# Patient Record
Sex: Male | Born: 1980 | Race: White | Hispanic: No | Marital: Married | Smoking: Never smoker
Health system: Southern US, Community
[De-identification: ages and names within clinical notes are randomized; demographics above are authoritative.]

## PROBLEM LIST (undated history)

## (undated) DIAGNOSIS — E669 Obesity, unspecified: Secondary | ICD-10-CM

## (undated) DIAGNOSIS — F988 Other specified behavioral and emotional disorders with onset usually occurring in childhood and adolescence: Secondary | ICD-10-CM

## (undated) DIAGNOSIS — M5090 Cervical disc disorder, unspecified, unspecified cervical region: Secondary | ICD-10-CM

## (undated) DIAGNOSIS — I861 Scrotal varices: Secondary | ICD-10-CM

## (undated) DIAGNOSIS — N433 Hydrocele, unspecified: Secondary | ICD-10-CM

## (undated) DIAGNOSIS — N200 Calculus of kidney: Secondary | ICD-10-CM

## (undated) DIAGNOSIS — F419 Anxiety disorder, unspecified: Secondary | ICD-10-CM

## (undated) HISTORY — DX: Anxiety disorder, unspecified: F41.9

## (undated) HISTORY — DX: Hydrocele, unspecified: N43.3

## (undated) HISTORY — DX: Obesity, unspecified: E66.9

## (undated) HISTORY — DX: Cervical disc disorder, unspecified, unspecified cervical region: M50.90

## (undated) HISTORY — DX: Calculus of kidney: N20.0

## (undated) HISTORY — DX: Rider (driver) (passenger) of other motorcycle injured in unspecified traffic accident, initial encounter: V29.99XA

## (undated) HISTORY — DX: Scrotal varices: I86.1

## (undated) HISTORY — PX: LAPAROSCOPIC INGUINAL HERNIA REPAIR PEDIATRIC: SHX6767

## (undated) HISTORY — DX: Other specified behavioral and emotional disorders with onset usually occurring in childhood and adolescence: F98.8

## (undated) HISTORY — PX: DG 2ND DIGIT LEFT HAND: HXRAD1642

---

## 2015-04-13 DIAGNOSIS — F988 Other specified behavioral and emotional disorders with onset usually occurring in childhood and adolescence: Secondary | ICD-10-CM | POA: Insufficient documentation

## 2015-04-13 DIAGNOSIS — F411 Generalized anxiety disorder: Secondary | ICD-10-CM | POA: Insufficient documentation

## 2015-04-13 DIAGNOSIS — F902 Attention-deficit hyperactivity disorder, combined type: Secondary | ICD-10-CM | POA: Insufficient documentation

## 2016-02-19 DIAGNOSIS — F1320 Sedative, hypnotic or anxiolytic dependence, uncomplicated: Secondary | ICD-10-CM | POA: Insufficient documentation

## 2017-06-05 ENCOUNTER — Ambulatory Visit (INDEPENDENT_AMBULATORY_CARE_PROVIDER_SITE_OTHER): Payer: BLUE CROSS/BLUE SHIELD

## 2017-06-05 ENCOUNTER — Ambulatory Visit (INDEPENDENT_AMBULATORY_CARE_PROVIDER_SITE_OTHER): Payer: BLUE CROSS/BLUE SHIELD | Admitting: Physician Assistant

## 2017-06-05 ENCOUNTER — Encounter: Payer: Self-pay | Admitting: Physician Assistant

## 2017-06-05 VITALS — BP 121/82 | HR 81 | Ht 67.0 in | Wt 203.0 lb

## 2017-06-05 DIAGNOSIS — M87059 Idiopathic aseptic necrosis of unspecified femur: Secondary | ICD-10-CM | POA: Diagnosis not present

## 2017-06-05 DIAGNOSIS — M5441 Lumbago with sciatica, right side: Secondary | ICD-10-CM | POA: Diagnosis not present

## 2017-06-05 DIAGNOSIS — Z7689 Persons encountering health services in other specified circumstances: Secondary | ICD-10-CM

## 2017-06-05 DIAGNOSIS — M5116 Intervertebral disc disorders with radiculopathy, lumbar region: Secondary | ICD-10-CM

## 2017-06-05 DIAGNOSIS — N50819 Testicular pain, unspecified: Secondary | ICD-10-CM | POA: Diagnosis not present

## 2017-06-05 DIAGNOSIS — M5117 Intervertebral disc disorders with radiculopathy, lumbosacral region: Secondary | ICD-10-CM

## 2017-06-05 HISTORY — DX: Idiopathic aseptic necrosis of unspecified femur: M87.059

## 2017-06-05 MED ORDER — CYCLOBENZAPRINE HCL 10 MG PO TABS: 10.0000 mg | ORAL_TABLET | Freq: Every evening | ORAL | 0 refills | Status: DC | PRN

## 2017-06-05 MED ORDER — PREDNISONE 50 MG PO TABS: 50.0000 mg | ORAL_TABLET | Freq: Every day | ORAL | 0 refills | Status: DC

## 2017-06-05 NOTE — Patient Instructions (Addendum)
For your back: - MRI today - stop Ibuprofen and Aleve - Prednisone daily for 5 days - Cyclobenzaprine at bedtime as needed for muscle tightness/spasm (will cause drowsiness) - formal physical therapy - follow-up with Sports Medicine in 4 weeks
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
## 2017-06-05 NOTE — Progress Notes (Signed)
MRI shows disc disease at L5-S1 and L4-L5 There is no spinal stenosis or cauda equina syndrome Nothing emergent or surgical Treatment plan does not change Physical therapy and follow-up with Sports Medicine

## 2017-06-07 ENCOUNTER — Other Ambulatory Visit: Payer: Self-pay | Admitting: Physician Assistant

## 2017-06-07 ENCOUNTER — Encounter: Payer: Self-pay | Admitting: Physician Assistant

## 2017-06-07 ENCOUNTER — Ambulatory Visit (INDEPENDENT_AMBULATORY_CARE_PROVIDER_SITE_OTHER): Payer: BLUE CROSS/BLUE SHIELD

## 2017-06-07 DIAGNOSIS — I861 Scrotal varices: Secondary | ICD-10-CM | POA: Diagnosis not present

## 2017-06-07 DIAGNOSIS — N50819 Testicular pain, unspecified: Secondary | ICD-10-CM

## 2017-06-07 DIAGNOSIS — N433 Hydrocele, unspecified: Secondary | ICD-10-CM

## 2017-06-07 HISTORY — DX: Hydrocele, unspecified: N43.3

## 2017-06-07 HISTORY — DX: Scrotal varices: I86.1

## 2017-06-07 NOTE — Progress Notes (Signed)
Ultrasound shows some bilateral hydroceles and varicoceles These are both benign findings. Sometimes they can cause pain and issues with fertility A hydrocele is a small fluid collection A varicocele is an abnormal group of veins I can refer him to urology if symptoms are bothersome to discuss surgical treatment

## 2017-07-05 ENCOUNTER — Encounter: Payer: BLUE CROSS/BLUE SHIELD | Admitting: Sports Medicine

## 2017-07-18 ENCOUNTER — Other Ambulatory Visit: Payer: Self-pay | Admitting: Physician Assistant

## 2017-07-18 DIAGNOSIS — M5441 Lumbago with sciatica, right side: Secondary | ICD-10-CM

## 2017-08-03 ENCOUNTER — Other Ambulatory Visit: Payer: Self-pay | Admitting: Physician Assistant

## 2017-08-03 DIAGNOSIS — M5441 Lumbago with sciatica, right side: Secondary | ICD-10-CM

## 2018-03-24 ENCOUNTER — Encounter: Payer: Self-pay | Admitting: Physician Assistant

## 2018-05-28 ENCOUNTER — Ambulatory Visit (INDEPENDENT_AMBULATORY_CARE_PROVIDER_SITE_OTHER): Payer: BLUE CROSS/BLUE SHIELD

## 2018-05-28 ENCOUNTER — Ambulatory Visit: Payer: BLUE CROSS/BLUE SHIELD | Admitting: Family Medicine

## 2018-05-28 DIAGNOSIS — M87059 Idiopathic aseptic necrosis of unspecified femur: Secondary | ICD-10-CM

## 2018-05-28 DIAGNOSIS — M25552 Pain in left hip: Secondary | ICD-10-CM | POA: Diagnosis not present

## 2018-05-28 MED ORDER — CELECOXIB 200 MG PO CAPS: ORAL_CAPSULE | ORAL | 2 refills | Status: DC

## 2018-05-28 NOTE — Patient Instructions (Addendum)
Thank you for coming in today. I think you have AVN causing early wear of the hip joint.  I also think you have tendonitis/bursitis of the hip muscles.  We will work on rehab the muscles and tendons which will help the pain in the later leg.  Do the side leg raise and the standing stretch.  Let me know who your preferred orthopedics is for hip replacement.   Use celebrex instead of iburpfoen for pain as need.  OK to take with tylenol .    Avascular Necrosis Avascular necrosis is death of bone tissue due to lack of blood supply. This condition may also be called:  Osteonecrosis.  Aseptic necrosis.  Ischemic bone necrosis. Without proper blood supply, the internal layer of the affected bone dies. Over time, small breaks form in the outer layer of the bone. If this process affects a bone near a joint, the joint may collapse or move out of place (become dislocated). Joints that may be affected include the jaw, wrist, fingers, knee, and foot. Avascular necrosis most commonly affects:  The hip joint, especially the top of the thigh bone (femoral head).  The top of the upper arm bone (humeral head). What are the causes? This condition may be caused by:  Damage or injury to a bone or joint.  Using steroid medicine such as prednisone for a long time.  Changes in the body's disease-fighting system (immune system).  Changes in the body's system of chemicals that regulate body processes (hormones).  A lot of exposure to radiation. What increases the risk? The following factors may make you more likely to develop this condition:  Alcohol abuse.  A history of joint injury.  Long-term or frequent use of steroid medicines.  Having a medical condition such as: ? HIV or AIDS. ? Diabetes. ? Sickle cell disease. ? A disease in which your body's immune system attacks your body's own healthy tissues (autoimmune disease). What are the signs or symptoms? The main symptoms of avascular  necrosis are:  Pain. If an affected joint collapses, the pain may suddenly get severe.  Decreased ability to move the affected bone or joint. How is this diagnosed? Avascular necrosis may be diagnosed based on:  Your symptoms and medical history.  A physical exam.  Imaging tests, such as X-rays, bone scans, and an MRI. How is this treated? Treatment for this condition may include:  Pain medicine, such as NSAIDs.  Medicine to improve bone growth.  Avoiding placing any pressure or weight on the affected area. If avascular necrosis occurs in your hip, ankle, or foot, you may need to use a device to help you move around (assistive device), such as crutches or a rolling scooter.  Physical therapy to help regain strength and motion in the affected area.  Surgery, such as: ? Core decompression. In this surgery, one or more holes are placed in the bone for new blood vessels to grow into. This provides a renewed blood supply to the bone. This surgery may reduce pain and pressure in the affected bone and slow the destruction of bones and joints. ? Osteotomy. In this surgery, the bone is reshaped to reduce stress on the affected area of the joint. ? Bone grafting. In this surgery, healthy bone from a different part of your body is used to replace damaged bone. ? Total joint replacement (arthroplasty). In this surgery, the affected surfaces of bone on one or both sides of a joint are replaced with artificial parts (prostheses).  Electrical stimulation (also called e-stim). During this procedure, a probe over the skin sends shock waves into the body. This may help to encourage new bone growth.  High-pressure oxygen therapy (hyperbaric oxygen). This is rarely used. Follow these instructions at home: Activity  Ask your health care provider what activities are safe for you.  If physical therapy was prescribed, do exercises as told by your health care provider.  Avoid placing any pressure or  weight on the affected area, as told by your health care provider. Use assistive devices as instructed. Managing pain, stiffness, and swelling  If directed, apply heat to the affected area as often as told by your health care provider. Heat can reduce the stiffness of your muscles and joints. Use the heat source that your health care provider recommends, such as a moist heat pack or a heating pad. ? Place a towel between your skin and the heat source. ? Leave the heat on for 20-30 minutes. ? Remove the heat if your skin turns bright red. This is especially important if you are unable to feel pain, heat, or cold. You may have a greater risk of getting burned.  If directed, put ice on affected areas. Icing can help to relieve joint pain and swelling. ? Put ice in a plastic bag. ? Place a towel between your skin and the bag. ? Leave the ice on for 20 minutes, 2-3 times a day. General instructions   Take over-the-counter and prescription medicines only as told by your health care provider.  Do not drive or use heavy machinery while taking prescription pain medicine.  If a bone in your arm or leg is affected, ask your health care provider if it is safe for you to drive.  Do not use any products that contain nicotine or tobacco, such as cigarettes and e-cigarettes. These can delay bone healing. If you need help quitting, ask your health care provider.  Do not drink alcohol.  Keep all follow-up visits as told by your health care provider. This is important. Contact a health care provider if:  You have pain that gets worse or does not get better with medicine.  Your ability to move your joint gets worse. Get help right away if:  Your pain suddenly becomes severe. Summary  Avascular necrosis is death of bone tissue due to a lack of blood supply.  Without proper blood supply, the internal layer of the affected bone dies. Over time, small breaks form in the outer layer of the  bone.  Avoid putting any pressure or weight on the affected area. You may need to use a device to help you move around (assistive device), such as crutches or a rolling scooter. This information is not intended to replace advice given to you by your health care provider. Make sure you discuss any questions you have with your health care provider. Document Released: 10/29/2001 Document Revised: 04/18/2017 Document Reviewed: 04/18/2017 Elsevier Interactive Patient Education  2019 [employer]    Trochanteric Bursitis Trochanteric bursitis is a condition that causes hip pain. Trochanteric bursitis happens when fluid-filled sacs (bursae) in the hip get irritated. Normally these sacs absorb shock and help strong bands of tissue (tendons) in your hip glide smoothly over each other and over your hip bones. What are the causes? This condition results from increased friction between the hip bones and the tendons that go over them. This condition can happen if you:  Have weak hips.  Use your hip muscles too much (overuse).  Get hit in the hip. What increases the risk? This condition is more likely to develop in:  Women.  Adults who are middle-aged or older.  People with arthritis or a spinal condition.  People with weak buttocks muscles (gluteal muscles).  People who have one leg that is shorter than the other.  People who participate in certain kinds of athletic activities, such as: ? Running sports, especially long-distance running. ? Contact sports, like football or martial arts. ? Sports in which falls may occur, like skiing. What are the signs or symptoms? The main symptom of this condition is pain and tenderness over the point of your hip. The pain may be:  Sharp and intense.  Dull and achy.  Felt on the outside of your thigh. It may increase when you:  Lie on your side.  Walk or run.  Go up on stairs.  Sit.  Stand up after sitting.  Stand for long periods of  time. How is this diagnosed? This condition may be diagnosed based on:  Your symptoms.  Your medical history.  A physical exam.  Imaging tests, such as: ? X-rays to check your bones. ? An MRI or ultrasound to check your tendons and muscles. During your physical exam, your health care provider will check the movement and strength of your hip. He or she may press on the point of your hip to check for pain. How is this treated? This condition may be treated by:  Resting.  Reducing your activity.  Avoiding activities that cause pain.  Using crutches, a cane, or a walker to decrease the strain on your hip.  Taking medicine to help with swelling.  Having medicine injected into the bursae to help with swelling.  Using ice, heat, and massage therapy for pain relief.  Physical therapy exercises for strength and flexibility.  Surgery (rare). Follow these instructions at home: Activity  Rest.  Avoid activities that cause pain.  Return to your normal activities as told by your health care provider. Ask your health care provider what activities are safe for you. Managing pain, stiffness, and swelling  Take over-the-counter and prescription medicines only as told by your health care provider.  If directed, apply heat to the injured area as told by your health care provider. ? Place a towel between your skin and the heat source. ? Leave the heat on for 20-30 minutes. ? Remove the heat if your skin turns bright red. This is especially important if you are unable to feel pain, heat, or cold. You may have a greater risk of getting burned.  If directed, apply ice to the injured area: ? Put ice in a plastic bag. ? Place a towel between your skin and the bag. ? Leave the ice on for 20 minutes, 2-3 times a day. General instructions  If the affected leg is one that you use for driving, ask your health care provider when it is safe to drive.  Use crutches, a cane, or a walker as  told by your health care provider.  If one of your legs is shorter than the other, get fitted for a shoe insert.  Lose weight if you are overweight. How is this prevented?  Wear supportive footwear that is appropriate for your sport.  If you have hip pain, start any new exercise or sport slowly.  Maintain physical fitness, including: ? Strength. ? Flexibility. Contact a health care provider if:  Your pain does not improve with 2-4 weeks. Get help right away  if:  You develop severe pain.  You have a fever.  You develop increased redness over your hip.  You have a change in your bowel function or bladder function.  You cannot control the muscles in your feet. This information is not intended to replace advice given to you by your health care provider. Make sure you discuss any questions you have with your health care provider. Document Released: 06/16/2004 Document Revised: 01/13/2016 Document Reviewed: 04/24/2015 Elsevier Interactive Patient Education  2019 [employer].
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
## 2018-06-15 ENCOUNTER — Telehealth: Payer: Self-pay | Admitting: Family Medicine

## 2018-06-15 NOTE — Telephone Encounter (Signed)
Received remote consultation/second opinion from orthopedic surgeon Dr. Lenor Coffin.  Comprehensive document reviewed.   Surgery recommends maximizing medical options but best long-term surgical option is total hip replacement.  Surgery does note that because there is already some collapse of femoral head steroid injection may be a reasonable option for temporary pain control.  We will send report to scan.

## 2018-07-03 ENCOUNTER — Encounter: Payer: Self-pay | Admitting: Family Medicine

## 2018-07-16 ENCOUNTER — Encounter: Payer: Self-pay | Admitting: Physician Assistant

## 2018-07-20 ENCOUNTER — Encounter: Payer: Self-pay | Admitting: Physician Assistant

## 2018-07-20 ENCOUNTER — Ambulatory Visit: Payer: BLUE CROSS/BLUE SHIELD | Admitting: Physician Assistant

## 2018-07-20 VITALS — BP 120/81 | HR 71 | Temp 98.2°F | Wt 197.0 lb

## 2018-07-20 DIAGNOSIS — H6191 Disorder of right external ear, unspecified: Secondary | ICD-10-CM

## 2018-07-20 DIAGNOSIS — H6192 Disorder of left external ear, unspecified: Secondary | ICD-10-CM | POA: Insufficient documentation

## 2018-07-20 DIAGNOSIS — F988 Other specified behavioral and emotional disorders with onset usually occurring in childhood and adolescence: Secondary | ICD-10-CM

## 2018-07-20 DIAGNOSIS — Z01818 Encounter for other preprocedural examination: Secondary | ICD-10-CM

## 2018-07-20 DIAGNOSIS — Z23 Encounter for immunization: Secondary | ICD-10-CM | POA: Diagnosis not present

## 2018-07-20 DIAGNOSIS — F419 Anxiety disorder, unspecified: Secondary | ICD-10-CM | POA: Diagnosis not present

## 2018-07-20 MED ORDER — LISDEXAMFETAMINE DIMESYLATE 50 MG PO CAPS: 50.0000 mg | ORAL_CAPSULE | Freq: Every day | ORAL | 0 refills | Status: DC

## 2018-07-20 NOTE — Patient Instructions (Signed)
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
## 2018-07-21 LAB — CBC WITH DIFFERENTIAL/PLATELET
Absolute Monocytes: 507 cells/uL (ref 200–950)
Basophils Absolute: 39 cells/uL (ref 0–200)
Basophils Relative: 0.6 %
EOS PCT: 5.8 %
Eosinophils Absolute: 377 cells/uL (ref 15–500)
HCT: 40.7 % (ref 38.5–50.0)
Hemoglobin: 13.9 g/dL (ref 13.2–17.1)
Lymphs Abs: 2308 cells/uL (ref 850–3900)
MCH: 29.3 pg (ref 27.0–33.0)
MCHC: 34.2 g/dL (ref 32.0–36.0)
MCV: 85.7 fL (ref 80.0–100.0)
MPV: 9.6 fL (ref 7.5–12.5)
Monocytes Relative: 7.8 %
NEUTROS ABS: 3270 {cells}/uL (ref 1500–7800)
Neutrophils Relative %: 50.3 %
PLATELETS: 253 10*3/uL (ref 140–400)
RBC: 4.75 10*6/uL (ref 4.20–5.80)
RDW: 12.8 % (ref 11.0–15.0)
Total Lymphocyte: 35.5 %
WBC: 6.5 10*3/uL (ref 3.8–10.8)

## 2018-07-21 LAB — URINALYSIS, ROUTINE W REFLEX MICROSCOPIC
Bilirubin Urine: NEGATIVE
Glucose, UA: NEGATIVE
Hgb urine dipstick: NEGATIVE
Ketones, ur: NEGATIVE
Leukocytes,Ua: NEGATIVE
Nitrite: NEGATIVE
Protein, ur: NEGATIVE
Specific Gravity, Urine: 1.016 (ref 1.001–1.03)
pH: 6.5 (ref 5.0–8.0)

## 2018-07-21 LAB — HEMOGLOBIN A1C
EAG (MMOL/L): 5.8 (calc)
HEMOGLOBIN A1C: 5.3 %{Hb} (ref <5.7)
Mean Plasma Glucose: 105 (calc)

## 2018-07-21 LAB — BASIC METABOLIC PANEL WITH GFR
BUN: 13 mg/dL (ref 7–25)
CALCIUM: 9.2 mg/dL (ref 8.6–10.3)
CO2: 26 mmol/L (ref 20–32)
CREATININE: 0.87 mg/dL (ref 0.60–1.35)
Chloride: 106 mmol/L (ref 98–110)
GFR, EST AFRICAN AMERICAN: 126 mL/min/{1.73_m2} (ref >=60)
GFR, Est Non African American: 109 mL/min/{1.73_m2} (ref >=60)
Glucose, Bld: 91 mg/dL (ref 65–99)
Potassium: 3.8 mmol/L (ref 3.5–5.3)
Sodium: 141 mmol/L (ref 135–146)

## 2018-07-21 LAB — URINE CULTURE
MICRO NUMBER: 258658
RESULT: NO GROWTH
SPECIMEN QUALITY: ADEQUATE

## 2018-07-25 ENCOUNTER — Encounter: Payer: Self-pay | Admitting: Physician Assistant

## 2018-07-25 ENCOUNTER — Ambulatory Visit: Payer: BLUE CROSS/BLUE SHIELD | Admitting: Physician Assistant

## 2018-07-25 VITALS — BP 128/84 | HR 83 | Temp 97.8°F | Wt 194.0 lb

## 2018-07-25 DIAGNOSIS — Z01818 Encounter for other preprocedural examination: Secondary | ICD-10-CM
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
## 2018-07-25 NOTE — Patient Instructions (Signed)
Stop Celebrex at least 2 days prior to surgery

## 2018-08-14 ENCOUNTER — Other Ambulatory Visit: Payer: Self-pay | Admitting: Physician Assistant

## 2018-08-14 DIAGNOSIS — F988 Other specified behavioral and emotional disorders with onset usually occurring in childhood and adolescence: Secondary | ICD-10-CM

## 2018-08-15 NOTE — Telephone Encounter (Signed)
Will send to provider for review .

## 2018-08-16 MED ORDER — LISDEXAMFETAMINE DIMESYLATE 50 MG PO CAPS: 50.0000 mg | ORAL_CAPSULE | Freq: Every day | ORAL | 0 refills | Status: DC

## 2018-08-24 ENCOUNTER — Encounter: Payer: Self-pay | Admitting: Family Medicine

## 2018-08-28 ENCOUNTER — Other Ambulatory Visit: Payer: Self-pay

## 2018-08-28 ENCOUNTER — Encounter: Payer: Self-pay | Admitting: Family Medicine

## 2018-08-28 ENCOUNTER — Ambulatory Visit (INDEPENDENT_AMBULATORY_CARE_PROVIDER_SITE_OTHER): Payer: BLUE CROSS/BLUE SHIELD | Admitting: Family Medicine

## 2018-08-28 DIAGNOSIS — G8929 Other chronic pain: Secondary | ICD-10-CM

## 2018-08-28 DIAGNOSIS — M87059 Idiopathic aseptic necrosis of unspecified femur: Secondary | ICD-10-CM | POA: Diagnosis not present

## 2018-08-28 MED ORDER — CELECOXIB 200 MG PO CAPS: 200.0000 mg | ORAL_CAPSULE | Freq: Two times a day (BID) | ORAL | 5 refills | Status: DC

## 2018-08-28 MED ORDER — TRAMADOL HCL 50 MG PO TABS: 50.0000 mg | ORAL_TABLET | Freq: Every day | ORAL | 0 refills | Status: DC | PRN
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
## 2018-08-28 NOTE — Patient Instructions (Addendum)
Thank you for coming in today.  Recheck in 3 weeks.  Use Celexrex and tyelneol for base line control.  Use tramdol for severe pain.  Recheck in 3 weeks via video visit.   Return sooner if needed.   Tramadol tablets What is this medicine? TRAMADOL (TRA ma dole) is a pain reliever. It is used to treat moderate to severe pain in adults. This medicine may be used for other purposes; ask your health care provider or pharmacist if you have questions. COMMON BRAND NAME(S): Ultram What should I tell my health care provider before I take this medicine? They need to know if you have any of these conditions: -brain tumor -depression -drug abuse or addiction -head injury -if you frequently drink alcohol containing drinks -kidney disease or trouble passing urine -liver disease -lung disease, asthma, or breathing problems -seizures or epilepsy -suicidal thoughts, plans, or attempt; a previous suicide attempt by you or a family member -an unusual or allergic reaction to tramadol, codeine, other medicines, foods, dyes, or preservatives -pregnant or trying to get pregnant -breast-feeding How should I use this medicine? Take this medicine by mouth with a full glass of water. Follow the directions on the prescription label. You can take it with or without food. If it upsets your stomach, take it with food. Do not take your medicine more often than directed. A special MedGuide will be given to you by the pharmacist with each prescription and refill. Be sure to read this information carefully each time. Talk to your pediatrician regarding the use of this medicine in children. Special care may be needed. Overdosage: If you think you have taken too much of this medicine contact a poison control center or emergency room at once. NOTE: This medicine is only for you. Do not share this medicine with others. What if I miss a dose? If you miss a dose, take it as soon as you can. If it is almost time for your  next dose, take only that dose. Do not take double or extra doses. What may interact with this medicine? Do not take this medication with any of the following medicines: -MAOIs like Carbex, Eldepryl, Marplan, Nardil, and Parnate This medicine may also interact with the following medications: -alcohol -antihistamines for allergy, cough and cold -certain medicines for anxiety or sleep -certain medicines for depression like amitriptyline, fluoxetine, sertraline -certain medicines for migraine headache like almotriptan, eletriptan, frovatriptan, naratriptan, rizatriptan, sumatriptan, zolmitriptan -certain medicines for seizures like carbamazepine, oxcarbazepine, phenobarbital, primidone -certain medicines that treat or prevent blood clots like warfarin -digoxin -furazolidone -general anesthetics like halothane, isoflurane, methoxyflurane, propofol -linezolid -local anesthetics like lidocaine, pramoxine, tetracaine -medicines that relax muscles for surgery -other narcotic medicines for pain or cough -phenothiazines like chlorpromazine, mesoridazine, prochlorperazine, thioridazine -procarbazine This list may not describe all possible interactions. Give your health care provider a list of all the medicines, herbs, non-prescription drugs, or dietary supplements you use. Also tell them if you smoke, drink alcohol, or use illegal drugs. Some items may interact with your medicine. What should I watch for while using this medicine? Tell your doctor or health care professional if your pain does not go away, if it gets worse, or if you have new or a different type of pain. You may develop tolerance to the medicine. Tolerance means that you will need a higher dose of the medicine for pain relief. Tolerance is normal and is expected if you take this medicine for a long time. Do not suddenly stop taking your  medicine because you may develop a severe reaction. Your body becomes used to the medicine. This does  NOT mean you are addicted. Addiction is a behavior related to getting and using a drug for a non-medical reason. If you have pain, you have a medical reason to take pain medicine. Your doctor will tell you how much medicine to take. If your doctor wants you to stop the medicine, the dose will be slowly lowered over time to avoid any side effects. There are different types of narcotic medicines (opiates). If you take more than one type at the same time or if you are taking another medicine that also causes drowsiness, you may have more side effects. Give your health care provider a list of all medicines you use. Your doctor will tell you how much medicine to take. Do not take more medicine than directed. Call emergency for help if you have problems breathing or unusual sleepiness. You may get drowsy or dizzy. Do not drive, use machinery, or do anything that needs mental alertness until you know how this medicine affects you. Do not stand or sit up quickly, especially if you are an older patient. This reduces the risk of dizzy or fainting spells. Alcohol can increase or decrease the effects of this medicine. Avoid alcoholic drinks. You may have constipation. Try to have a bowel movement at least every 2 to 3 days. If you do not have a bowel movement for 3 days, call your doctor or health care professional. Your mouth may get dry. Chewing sugarless gum or sucking hard candy, and drinking plenty of water may help. Contact your doctor if the problem does not go away or is severe. What side effects may I notice from receiving this medicine? Side effects that you should report to your doctor or health care professional as soon as possible: -allergic reactions like skin rash, itching or hives, swelling of the face, lips, or tongue -breathing problems -confusion -seizures -signs and symptoms of low blood pressure like dizziness; feeling faint or lightheaded, falls; unusually weak or tired -trouble passing urine  or change in the amount of urine Side effects that usually do not require medical attention (report to your doctor or health care professional if they continue or are bothersome): -constipation -dry mouth -nausea, vomiting -tiredness This list may not describe all possible side effects. Call your doctor for medical advice about side effects. You may report side effects to FDA at 1-800-FDA-1088. Where should I keep my medicine? Keep out of the reach of children. This medicine may cause accidental overdose and death if it taken by other adults, children, or pets. Mix any unused medicine with a substance like cat litter or coffee grounds. Then throw the medicine away in a sealed container like a sealed bag or a coffee can with a lid. Do not use the medicine after the expiration date. Store at room temperature between 15 and 30 degrees C (59 and 86 degrees F). NOTE: This sheet is a summary. It may not cover all possible information. If you have questions about this medicine, talk to your doctor, pharmacist, or health care provider.  2019 Elsevier/Gold Standard (2015-02-01 09:00:04)

## 2018-08-29 ENCOUNTER — Telehealth: Payer: Self-pay | Admitting: Physician Assistant

## 2018-08-29 MED ORDER — TRAMADOL HCL 50 MG PO TABS: 50.0000 mg | ORAL_TABLET | Freq: Every day | ORAL | 0 refills | Status: DC | PRN

## 2018-08-29 NOTE — Telephone Encounter (Signed)
-----   Message from Evan S Corey, MD sent at 08/28/2018  2:11 PM EDT ----- °Regarding: Recheck in 3 weeks °Please schedule follow up visit in 3 weeks via webex or mychart video for follow up hip pain and tramadol ° °

## 2018-08-29 NOTE — Telephone Encounter (Signed)
Called the pharmacy, there is no record of where he has filled this Rx before. They looked all the way back to 2019. I also do not see record of previous Tramadol Rx on his chart here. The pharmacy cut the Rx that was sent yesterday and Pt picked up the 7 day supply. Provider can now send 30 day Rx for fill.

## 2018-08-29 NOTE — Telephone Encounter (Signed)
-----   Message from Rodolph Bong, MD sent at 08/28/2018  2:11 PM EDT ----- Regarding: Recheck in 3 weeks Please schedule follow up visit in 3 weeks via webex or mychart video for follow up hip pain and tramadol

## 2018-08-29 NOTE — Telephone Encounter (Signed)
[facility] in [place] phone number [phone] and advised that the tramadol prescription I sent yesterday was for 30-day supply for chronic pain complaint.  This should have dispense 30 days and not 7.  Let me know if I need to change the prescription or send a new one.  Additionally please inform patient once this has been completed.

## 2018-08-29 NOTE — Telephone Encounter (Signed)
Appointment scheduled for webex. No further questions at this.

## 2018-09-06 ENCOUNTER — Encounter: Payer: Self-pay | Admitting: Family Medicine

## 2018-09-09 ENCOUNTER — Encounter: Payer: Self-pay | Admitting: Family Medicine

## 2018-09-11 ENCOUNTER — Encounter: Payer: Self-pay | Admitting: Family Medicine

## 2018-09-11 ENCOUNTER — Other Ambulatory Visit: Payer: Self-pay

## 2018-09-11 ENCOUNTER — Ambulatory Visit: Payer: BLUE CROSS/BLUE SHIELD | Admitting: Family Medicine

## 2018-09-11 ENCOUNTER — Ambulatory Visit (INDEPENDENT_AMBULATORY_CARE_PROVIDER_SITE_OTHER): Payer: BLUE CROSS/BLUE SHIELD

## 2018-09-11 VITALS — BP 113/72 | HR 79 | Temp 98.2°F | Wt 195.0 lb

## 2018-09-11 DIAGNOSIS — M87059 Idiopathic aseptic necrosis of unspecified femur: Secondary | ICD-10-CM

## 2018-09-11 DIAGNOSIS — M25562 Pain in left knee: Secondary | ICD-10-CM

## 2018-09-11 DIAGNOSIS — M5416 Radiculopathy, lumbar region: Secondary | ICD-10-CM | POA: Diagnosis not present

## 2018-09-11 DIAGNOSIS — G8929 Other chronic pain: Secondary | ICD-10-CM | POA: Diagnosis not present

## 2018-09-11 MED ORDER — DICLOFENAC SODIUM 1 % TD GEL: 4.0000 g | Freq: Four times a day (QID) | TRANSDERMAL | 11 refills | Status: DC

## 2018-09-11 MED ORDER — PREDNISONE 10 MG (48) PO TBPK: ORAL_TABLET | Freq: Every day | ORAL | 0 refills | Status: DC

## 2018-09-11 MED ORDER — HYDROCODONE-ACETAMINOPHEN 5-325 MG PO TABS: 1.0000 | ORAL_TABLET | Freq: Every day | ORAL | 0 refills | Status: DC
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
## 2018-09-11 NOTE — Patient Instructions (Signed)
Thank you for coming in today.  I think the knee pain is caused by prepatella bursitis.   I think some of the calf pain is because of a pinched nerve in the back.   Plan:  Call your orthopedist and see if they are restarting the schedule.  You may be able to state that your hip pain is bad enough that it is no longer elective.   For the knee apply the diclofenac gel. If you have trouble getting it use goodrx.  The prednisone will also help.  Work on the knee exercises.   For the calf pain take prednisone. We can use gabapentin in the future.    Use Norco (hydocodone) for severe pain.    Keep me updated.    Come back or go to the emergency room if you notice new weakness new numbness problems walking or bowel or bladder problems.    Prepatellar Bursitis Rehab Ask your health care provider which exercises are safe for you. Do exercises exactly as told by your health care provider and adjust them as directed. It is normal to feel mild stretching, pulling, tightness, or discomfort as you do these exercises, but you should stop right away if you feel sudden pain or your pain gets worse.Do not begin these exercises until told by your health care provider. Stretching and range of motion exercises These exercises warm up your muscles and joints and improve the movement and flexibility of your knee. These exercises also help to relieve pain, numbness, and tingling. Exercise A: Hamstring, standing  1. Stand with your __________ foot resting on a chair. Your __________ leg should be fully extended. 2. Arch your lower back slightly. 3. Leading with your chest, lean forward at the waist until you feel a gentle stretch in the back of your __________ knee or in your thigh. You should not need to lean far to feel a stretch. 4. Hold this position for __________ seconds. Repeat __________ times. Complete this stretch __________ times a day. Exercise B: Knee flexion, active heel slides 1. Lie on  your back with both knees straight. If this causes back discomfort, bend your __________ knee so your foot is flat on the floor. 2. Slowly slide your __________ heel back toward your buttocks until you feel a gentle stretch in the front of your knee or thigh. 3. Hold this position for __________ seconds. 4. Slowly slide your __________ heel back to the starting position. Repeat __________ times. Complete this stretch __________ times a day. Strengthening exercises These exercises build strength and endurance in your knee. Endurance is the ability to use your muscles for a long time, even after they get tired. Exercise C: Quadriceps, isometric  1. Lie on your back with your __________ leg extended and your __________ knee bent. 2. Slowly tense the muscles in the front of your __________ thigh. When you do this, you should see your kneecap slide up toward your hip or see increased dimpling just above the knee. This motion will push the back of your knee down toward the surface that is under it. 3. For __________ seconds, keep the muscle as tight as you can without increasing your pain. 4. Relax the muscles slowly and completely. Repeat __________ times. Complete this exercise __________ times a day. Exercise D: Straight leg raises (quadriceps) 1. Lie on your back with your __________ leg extended and your __________ knee bent. 2. Slowly tense the muscles in your __________ thigh. When you do this, you should see  your kneecap slide up toward your hip or see increased dimpling just above the knee. 3. Keep these muscles tight as you raise your leg 4-6 inches (10-15 cm) off the floor. 4. Hold this position for __________ seconds. 5. Keep these muscles tense as you lower your leg slowly. 6. Relax your muscles slowly and completely. Repeat __________ times. Complete this exercise __________ times a day. Exercise E: Straight leg raises (hip extensors) 1. Lie on your abdomen on a bed or a firm surface.  You can put a pillow under your hips if that is more comfortable. 2. Tense your buttock muscles and lift your __________ thigh. Your __________ knee can be bent or straight, but do not let your back arch. 3. Hold this position for __________ seconds. 4. Slowly lower your leg to the starting position. 5. Let your muscles relax completely. Repeat __________ times. Complete this exercise __________ times a day. This information is not intended to replace advice given to you by your health care provider. Make sure you discuss any questions you have with your health care provider. Document Released: 05/09/2005 Document Revised: 01/12/2016 Document Reviewed: 02/06/2015 Elsevier Interactive Patient Education  2019 [employer] Inc.    Sciatica  Sciatica is pain, numbness, weakness, or tingling along the path of the sciatic nerve. The sciatic nerve starts in the lower back and runs down the back of each leg. The nerve controls the muscles in the lower leg and in the back of the knee. It also provides feeling (sensation) to the back of the thigh, the lower leg, and the sole of the foot. Sciatica is a symptom of another medical condition that pinches or puts pressure on the sciatic nerve. Generally, sciatica only affects one side of the body. Sciatica usually goes away on its own or with treatment. In some cases, sciatica may keep coming back (recur). What are the causes? This condition is caused by pressure on the sciatic nerve, or pinching of the sciatic nerve. This may be the result of:  A disk in between the bones of the spine (vertebrae) bulging out too far (herniated disk).  Age-related changes in the spinal disks (degenerative disk disease).  A pain disorder that affects a muscle in the buttock (piriformis syndrome).  Extra bone growth (bone spur) near the sciatic nerve.  An injury or break (fracture) of the pelvis.  Pregnancy.  Tumor (rare). What increases the risk? The following factors  may make you more likely to develop this condition:  Playing sports that place pressure or stress on the spine, such as football or weight lifting.  Having poor strength and flexibility.  A history of back injury.  A history of back surgery.  Sitting for long periods of time.  Doing activities that involve repetitive bending or lifting.  Obesity. What are the signs or symptoms? Symptoms can vary from mild to very severe, and they may include:  Any of these problems in the lower back, leg, hip, or buttock: ? Mild tingling or dull aches. ? Burning sensations. ? Sharp pains.  Numbness in the back of the calf or the sole of the foot.  Leg weakness.  Severe back pain that makes movement difficult. These symptoms may get worse when you cough, sneeze, or laugh, or when you sit or stand for long periods of time. Being overweight may also make symptoms worse. In some cases, symptoms may recur over time. How is this diagnosed? This condition may be diagnosed based on:  Your symptoms.  A  physical exam. Your health care provider may ask you to do certain movements to check whether those movements trigger your symptoms.  You may have tests, including: ? Blood tests. ? X-rays. ? MRI. ? CT scan. How is this treated? In many cases, this condition improves on its own, without any treatment. However, treatment may include:  Reducing or modifying physical activity during periods of pain.  Exercising and stretching to strengthen your abdomen and improve the flexibility of your spine.  Icing and applying heat to the affected area.  Medicines that help: ? To relieve pain and swelling. ? To relax your muscles.  Injections of medicines that help to relieve pain, irritation, and inflammation around the sciatic nerve (steroids).  Surgery. Follow these instructions at home: Medicines  Take over-the-counter and prescription medicines only as told by your health care provider.  Do  not drive or operate heavy machinery while taking prescription pain medicine. Managing pain  If directed, apply ice to the affected area. ? Put ice in a plastic bag. ? Place a towel between your skin and the bag. ? Leave the ice on for 20 minutes, 2-3 times a day.  After icing, apply heat to the affected area before you exercise or as often as told by your health care provider. Use the heat source that your health care provider recommends, such as a moist heat pack or a heating pad. ? Place a towel between your skin and the heat source. ? Leave the heat on for 20-30 minutes. ? Remove the heat if your skin turns bright red. This is especially important if you are unable to feel pain, heat, or cold. You may have a greater risk of getting burned. Activity  Return to your normal activities as told by your health care provider. Ask your health care provider what activities are safe for you. ? Avoid activities that make your symptoms worse.  Take brief periods of rest throughout the day. Resting in a lying or standing position is usually better than sitting to rest. ? When you rest for longer periods, mix in some mild activity or stretching between periods of rest. This will help to prevent stiffness and pain. ? Avoid sitting for long periods of time without moving. Get up and move around at least one time each hour.  Exercise and stretch regularly, as told by your health care provider.  Do not lift anything that is heavier than 10 lb (4.5 kg) while you have symptoms of sciatica. When you do not have symptoms, you should still avoid heavy lifting, especially repetitive heavy lifting.  When you lift objects, always use proper lifting technique, which includes: ? Bending your knees. ? Keeping the load close to your body. ? Avoiding twisting. General instructions  Use good posture. ? Avoid leaning forward while sitting. ? Avoid hunching over while standing.  Maintain a healthy weight.  Excess weight puts extra stress on your back and makes it difficult to maintain good posture.  Wear supportive, comfortable shoes. Avoid wearing high heels.  Avoid sleeping on a mattress that is too soft or too hard. A mattress that is firm enough to support your back when you sleep may help to reduce your pain.  Keep all follow-up visits as told by your health care provider. This is important. Contact a health care provider if:  You have pain that wakes you up when you are sleeping.  You have pain that gets worse when you lie down.  Your pain is worse  than you have experienced in the past.  Your pain lasts longer than 4 weeks.  You experience unexplained weight loss. Get help right away if:  You lose control of your bowel or bladder (incontinence).  You have: ? Weakness in your lower back, pelvis, buttocks, or legs that gets worse. ? Redness or swelling of your back. ? A burning sensation when you urinate. This information is not intended to replace advice given to you by your health care provider. Make sure you discuss any questions you have with your health care provider. Document Released: 05/03/2001 Document Revised: 10/13/2015 Document Reviewed: 01/16/2015 Elsevier Interactive Patient Education  2019 [employer]

## 2018-09-18 ENCOUNTER — Encounter: Payer: Self-pay | Admitting: Family Medicine

## 2018-09-18 MED ORDER — HYDROCODONE-ACETAMINOPHEN 5-325 MG PO TABS: 1.0000 | ORAL_TABLET | Freq: Every day | ORAL | 0 refills | Status: DC

## 2018-09-19 ENCOUNTER — Ambulatory Visit: Payer: BLUE CROSS/BLUE SHIELD | Admitting: Family Medicine

## 2018-09-20 ENCOUNTER — Encounter: Payer: Self-pay | Admitting: Physician Assistant

## 2018-09-20 DIAGNOSIS — F988 Other specified behavioral and emotional disorders with onset usually occurring in childhood and adolescence: Secondary | ICD-10-CM

## 2018-09-21 MED ORDER — LISDEXAMFETAMINE DIMESYLATE 50 MG PO CAPS: 50.0000 mg | ORAL_CAPSULE | Freq: Every day | ORAL | 0 refills | Status: DC

## 2018-09-27 ENCOUNTER — Encounter: Payer: Self-pay | Admitting: Family Medicine

## 2018-10-05 ENCOUNTER — Encounter: Payer: Self-pay | Admitting: Family Medicine

## 2018-10-05 MED ORDER — HYDROCODONE-ACETAMINOPHEN 5-325 MG PO TABS: 1.0000 | ORAL_TABLET | Freq: Every day | ORAL | 0 refills | Status: DC

## 2018-10-05 NOTE — Telephone Encounter (Signed)
Norco refilled PDMP reviewed during this encounter.  

## 2018-10-10 ENCOUNTER — Encounter: Payer: Self-pay | Admitting: Family Medicine

## 2018-10-18 ENCOUNTER — Encounter: Payer: Self-pay | Admitting: Physician Assistant

## 2018-10-18 ENCOUNTER — Encounter: Payer: Self-pay | Admitting: Family Medicine

## 2018-10-18 ENCOUNTER — Ambulatory Visit (INDEPENDENT_AMBULATORY_CARE_PROVIDER_SITE_OTHER): Payer: BLUE CROSS/BLUE SHIELD | Admitting: Physician Assistant

## 2018-10-18 ENCOUNTER — Other Ambulatory Visit: Payer: Self-pay

## 2018-10-18 ENCOUNTER — Ambulatory Visit (INDEPENDENT_AMBULATORY_CARE_PROVIDER_SITE_OTHER): Payer: BLUE CROSS/BLUE SHIELD | Admitting: Family Medicine

## 2018-10-18 VITALS — BP 120/64 | HR 88 | Temp 98.3°F | Wt 192.0 lb

## 2018-10-18 DIAGNOSIS — M25562 Pain in left knee: Secondary | ICD-10-CM | POA: Diagnosis not present

## 2018-10-18 DIAGNOSIS — F988 Other specified behavioral and emotional disorders with onset usually occurring in childhood and adolescence: Secondary | ICD-10-CM

## 2018-10-18 DIAGNOSIS — F411 Generalized anxiety disorder: Secondary | ICD-10-CM

## 2018-10-18 MED ORDER — LISDEXAMFETAMINE DIMESYLATE 50 MG PO CAPS: 50.0000 mg | ORAL_CAPSULE | Freq: Every day | ORAL | 0 refills | Status: DC

## 2018-10-18 MED ORDER — ALPRAZOLAM 0.5 MG PO TABS: 0.5000 mg | ORAL_TABLET | Freq: Once | ORAL | 2 refills | Status: DC | PRN

## 2018-10-18 NOTE — Patient Instructions (Signed)
Thank you for coming in today. Call or go to the ER if you develop a large red swollen joint with extreme pain or oozing puss.  Keep me updated.  Return sooner if needed.
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
## 2018-10-29 ENCOUNTER — Encounter: Payer: Self-pay | Admitting: Physician Assistant

## 2018-10-30 ENCOUNTER — Encounter: Payer: Self-pay | Admitting: Family Medicine

## 2018-10-30 DIAGNOSIS — Z96649 Presence of unspecified artificial hip joint: Secondary | ICD-10-CM

## 2018-11-08 HISTORY — PX: OTHER SURGICAL HISTORY: SHX169

## 2018-11-16 ENCOUNTER — Ambulatory Visit: Payer: BC Managed Care – PPO | Admitting: Family Medicine

## 2018-11-16 ENCOUNTER — Other Ambulatory Visit: Payer: Self-pay

## 2018-11-16 ENCOUNTER — Encounter: Payer: Self-pay | Admitting: Family Medicine

## 2018-11-16 ENCOUNTER — Ambulatory Visit (INDEPENDENT_AMBULATORY_CARE_PROVIDER_SITE_OTHER): Payer: BC Managed Care – PPO | Admitting: Rehabilitative and Restorative Service Providers"

## 2018-11-16 ENCOUNTER — Encounter: Payer: Self-pay | Admitting: Rehabilitative and Restorative Service Providers"

## 2018-11-16 ENCOUNTER — Ambulatory Visit: Payer: BLUE CROSS/BLUE SHIELD | Admitting: Physician Assistant

## 2018-11-16 VITALS — BP 113/86 | HR 95 | Ht 66.0 in | Wt 194.0 lb

## 2018-11-16 DIAGNOSIS — R2689 Other abnormalities of gait and mobility: Secondary | ICD-10-CM | POA: Diagnosis not present

## 2018-11-16 DIAGNOSIS — R29898 Other symptoms and signs involving the musculoskeletal system: Secondary | ICD-10-CM

## 2018-11-16 DIAGNOSIS — Z96642 Presence of left artificial hip joint: Secondary | ICD-10-CM | POA: Diagnosis not present

## 2018-11-16 DIAGNOSIS — M25552 Pain in left hip: Secondary | ICD-10-CM | POA: Diagnosis not present

## 2018-11-16 DIAGNOSIS — M6281 Muscle weakness (generalized): Secondary | ICD-10-CM

## 2018-11-16 MED ORDER — HYDROCODONE-ACETAMINOPHEN 5-325 MG PO TABS: 1.0000 | ORAL_TABLET | Freq: Every day | ORAL | 0 refills | Status: DC
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
## 2018-11-16 NOTE — Patient Instructions (Addendum)
Thank you for coming in today. Attend PT.  Let me know if your leg swelling returns.  Keep me updated.  Recheck with me in 4 weeks.   Use pain medicine sparingly.

## 2018-11-16 NOTE — Patient Instructions (Signed)
Access Code: [email or web address]  URL: [email or web address]  Date: 11/16/2018  Prepared by: Gillermo Murdoch   Exercises  Supine Quad Set - 10 reps - 1 sets - 10sec hold - 2x daily - 7x weekly  Small Range Straight Leg Raise - 10 reps - 1 sets - 5 sec hold - 2x daily - 7x weekly  Supine Heel Slide with Strap - 10 reps - 1 sets - 10 sec hold - 2x daily - 7x weekly  Seated Knee Flexion Slide - 5-10 reps - 1 sets - 10 sec hold - 2x daily - 7x weekly  Hooklying Hamstring Stretch with Strap - 3 reps - 1 sets - 30 sec hold - 2x daily - 7x weekly  Standing Weight Shift - 3 reps - 1 sets - 30 sec hold - 2x daily - 7x weekly  Half Sun Salutation Flow with Feet Apart, Knees Bent, Midline Dive, Hand Together, Hands to shins and Flat back - 5 reps - 1 sets - 2x daily - 7x weekly

## 2018-11-19 ENCOUNTER — Ambulatory Visit: Payer: BLUE CROSS/BLUE SHIELD | Admitting: Family Medicine

## 2018-11-20 ENCOUNTER — Encounter: Payer: Self-pay | Admitting: Physician Assistant

## 2018-11-20 DIAGNOSIS — F988 Other specified behavioral and emotional disorders with onset usually occurring in childhood and adolescence: Secondary | ICD-10-CM

## 2018-11-20 DIAGNOSIS — F411 Generalized anxiety disorder: Secondary | ICD-10-CM

## 2018-11-21 MED ORDER — ALPRAZOLAM 0.5 MG PO TABS: 0.5000 mg | ORAL_TABLET | Freq: Once | ORAL | 0 refills | Status: DC | PRN

## 2018-11-21 MED ORDER — LISDEXAMFETAMINE DIMESYLATE 50 MG PO CAPS: 50.0000 mg | ORAL_CAPSULE | Freq: Every day | ORAL | 0 refills | Status: DC

## 2018-11-22 ENCOUNTER — Other Ambulatory Visit: Payer: Self-pay

## 2018-11-22 ENCOUNTER — Ambulatory Visit (INDEPENDENT_AMBULATORY_CARE_PROVIDER_SITE_OTHER): Payer: BC Managed Care – PPO | Admitting: Physical Therapy

## 2018-11-22 DIAGNOSIS — R2689 Other abnormalities of gait and mobility: Secondary | ICD-10-CM | POA: Diagnosis not present

## 2018-11-22 DIAGNOSIS — R29898 Other symptoms and signs involving the musculoskeletal system: Secondary | ICD-10-CM

## 2018-11-22 DIAGNOSIS — M25552 Pain in left hip: Secondary | ICD-10-CM | POA: Diagnosis not present

## 2018-11-22 DIAGNOSIS — M6281 Muscle weakness (generalized): Secondary | ICD-10-CM
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
## 2018-11-26 ENCOUNTER — Encounter: Payer: BC Managed Care – PPO | Admitting: Rehabilitative and Restorative Service Providers"

## 2018-11-27 ENCOUNTER — Telehealth: Payer: Self-pay | Admitting: Family Medicine

## 2018-11-27 ENCOUNTER — Encounter: Payer: Self-pay | Admitting: Family Medicine

## 2018-11-27 NOTE — Telephone Encounter (Signed)
Received notes regarding recent hip replacement at [facility] in [place]. Documentation does include postoperative total hip care guidelines including postop x-ray return to work protocol antibiotic prophylaxis etc.  We will send a copy to scan but also will keep a hard copy.

## 2018-11-28 MED ORDER — LISDEXAMFETAMINE DIMESYLATE 50 MG PO CAPS: 50.0000 mg | ORAL_CAPSULE | Freq: Every day | ORAL | 0 refills | Status: DC

## 2018-11-28 NOTE — Addendum Note (Signed)
Addended by: Nelson Chimes E on: 11/28/2018 10:28 AM   Modules accepted: Orders

## 2018-11-29 ENCOUNTER — Other Ambulatory Visit: Payer: Self-pay

## 2018-11-29 ENCOUNTER — Ambulatory Visit (INDEPENDENT_AMBULATORY_CARE_PROVIDER_SITE_OTHER): Payer: BC Managed Care – PPO | Admitting: Rehabilitative and Restorative Service Providers"

## 2018-11-29 ENCOUNTER — Encounter: Payer: Self-pay | Admitting: Rehabilitative and Restorative Service Providers"

## 2018-11-29 DIAGNOSIS — R29898 Other symptoms and signs involving the musculoskeletal system: Secondary | ICD-10-CM

## 2018-11-29 DIAGNOSIS — M25552 Pain in left hip: Secondary | ICD-10-CM

## 2018-11-29 DIAGNOSIS — R2689 Other abnormalities of gait and mobility: Secondary | ICD-10-CM | POA: Diagnosis not present

## 2018-11-29 DIAGNOSIS — M6281 Muscle weakness (generalized): Secondary | ICD-10-CM | POA: Diagnosis not present

## 2018-11-29 NOTE — Patient Instructions (Signed)
Access Code: [email or web address]  URL: [email or web address]  Date: 11/29/2018  Prepared by: Gillermo Murdoch   Exercises  Supine Quad Set - 10 reps - 1 sets - 10sec hold - 2x daily - 7x weekly  Small Range Straight Leg Raise - 10 reps - 1 sets - 5 sec hold - 2x daily - 7x weekly  Supine Heel Slide with Strap - 10 reps - 1 sets - 10 sec hold - 2x daily - 7x weekly  Seated Knee Flexion Slide - 5-10 reps - 1 sets - 10 sec hold - 2x daily - 7x weekly  Hooklying Hamstring Stretch with Strap - 3 reps - 1 sets - 30 sec hold - 2x daily - 7x weekly  Standing Weight Shift - 3 reps - 1 sets - 30 sec hold - 2x daily - 7x weekly  Half Sun Salutation Flow with Feet Apart, Knees Bent, Midline Dive, Hand Together, Hands to shins and Flat back - 5 reps - 1 sets - 2x daily - 7x weekly  Today   Prone Quadriceps Set - 10 reps - 1-2 sets - 5-10 sec hold - 2x daily - 7x weekly  Prone Quadriceps Stretch with Strap - 3 reps - 1 sets - 30 sec hold - 2x daily - 7x weekly    Kinesiology tape What is kinesiology tape?  There are many brands of kinesiology tape.  KTape, [employer], [employer], Dynamic tape, to name a few. It is an elasticized tape designed to support the body's natural healing process. This tape provides stability and support to muscles and joints without restricting motion. It can also help decrease swelling in the area of application. How does it work? The tape microscopically lifts and decompresses the skin to allow for drainage of lymph (swelling) to flow away from area, reducing inflammation.  The tape has the ability to help re-educate the neuromuscular system by targeting specific receptors in the skin.  The presence of the tape increases the body's awareness of posture and body mechanics.  Do not use with: . Open wounds . Skin lesions . Adhesive allergies Safe removal of the tape: In some rare cases, mild/moderate skin irritation can occur.  This can include redness, itchiness, or hives. If  this occurs, immediately remove tape and consult your primary care physician if symptoms are severe or do not resolve within 2 days.  To remove tape safely, hold nearby skin with one hand and gentle roll tape down with other hand.  You can apply oil or conditioner to tape while in shower prior to removal to loosen adhesive.  DO NOT swiftly rip tape off like a band-aid, as this could cause skin tears and additional skin irritation.
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
## 2018-12-02 ENCOUNTER — Encounter: Payer: Self-pay | Admitting: Family Medicine

## 2018-12-03 MED ORDER — HYDROCODONE-ACETAMINOPHEN 5-325 MG PO TABS: 1.0000 | ORAL_TABLET | Freq: Every day | ORAL | 0 refills | Status: DC

## 2018-12-05 ENCOUNTER — Encounter: Payer: Self-pay | Admitting: Rehabilitative and Restorative Service Providers"

## 2018-12-05 ENCOUNTER — Ambulatory Visit (INDEPENDENT_AMBULATORY_CARE_PROVIDER_SITE_OTHER): Payer: BC Managed Care – PPO | Admitting: Rehabilitative and Restorative Service Providers"

## 2018-12-05 ENCOUNTER — Other Ambulatory Visit: Payer: Self-pay

## 2018-12-05 DIAGNOSIS — R2689 Other abnormalities of gait and mobility: Secondary | ICD-10-CM

## 2018-12-05 DIAGNOSIS — M25552 Pain in left hip: Secondary | ICD-10-CM | POA: Diagnosis not present

## 2018-12-05 DIAGNOSIS — M6281 Muscle weakness (generalized): Secondary | ICD-10-CM | POA: Diagnosis not present

## 2018-12-05 DIAGNOSIS — R29898 Other symptoms and signs involving the musculoskeletal system: Secondary | ICD-10-CM

## 2018-12-05 NOTE — Patient Instructions (Signed)
Access Code: [email or web address]  URL: [email or web address]  Date: 12/05/2018  Prepared by: Gillermo Murdoch   Exercises  Supine Quad Set - 10 reps - 1 sets - 10sec hold - 2x daily - 7x weekly  Small Range Straight Leg Raise - 10 reps - 1 sets - 5 sec hold - 2x daily - 7x weekly  Supine Heel Slide with Strap - 10 reps - 1 sets - 10 sec hold - 2x daily - 7x weekly  Seated Knee Flexion Slide - 5-10 reps - 1 sets - 10 sec hold - 2x daily - 7x weekly  Hooklying Hamstring Stretch with Strap - 3 reps - 1 sets - 30 sec hold - 2x daily - 7x weekly  Standing Weight Shift - 3 reps - 1 sets - 30 sec hold - 2x daily - 7x weekly  Half Sun Salutation Flow with Feet Apart, Knees Bent, Midline Dive, Hand Together, Hands to shins and Flat back - 5 reps - 1 sets - 2x daily - 7x weekly  Prone Quadriceps Set - 10 reps - 1-2 sets - 5-10 sec hold - 2x daily - 7x weekly  Prone Quadriceps Stretch with Strap - 3 reps - 1 sets - 30 sec hold - 2x daily - 7x weekly  Seated Hip Flexor Stretch - 3 reps - 1 sets - 30 sec hold - 2x daily - 7x weekly  Prone Hip Extension - 5-10 reps - 1 sets - 2-3 sec hold - 2x daily - 7x weekly  Standing Hip Extension at Wall - 10 reps - 1 sets - 2-3 sec hold - 2x daily - 7x weekly  Standing Hip Abduction - 10 reps - 1 sets - 2-3 sec hold - 2x daily - 7x weekly
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
## 2018-12-12 ENCOUNTER — Encounter: Payer: Self-pay | Admitting: Rehabilitative and Restorative Service Providers"

## 2018-12-12 ENCOUNTER — Other Ambulatory Visit: Payer: Self-pay

## 2018-12-12 ENCOUNTER — Ambulatory Visit (INDEPENDENT_AMBULATORY_CARE_PROVIDER_SITE_OTHER): Payer: BC Managed Care – PPO | Admitting: Rehabilitative and Restorative Service Providers"

## 2018-12-12 DIAGNOSIS — M6281 Muscle weakness (generalized): Secondary | ICD-10-CM | POA: Diagnosis not present

## 2018-12-12 DIAGNOSIS — M25552 Pain in left hip: Secondary | ICD-10-CM

## 2018-12-12 DIAGNOSIS — R29898 Other symptoms and signs involving the musculoskeletal system: Secondary | ICD-10-CM | POA: Diagnosis not present

## 2018-12-12 DIAGNOSIS — R2689 Other abnormalities of gait and mobility: Secondary | ICD-10-CM | POA: Diagnosis not present

## 2018-12-12 NOTE — Patient Instructions (Addendum)
  FUNCTIONAL MOBILITY: Lateral Step Up    Step up sideways, leading with left leg. _5__ reps per set, __2-3_ sets per day   Access Code: [email or web address]  URL: [email or web address]  Date: 12/12/2018  Prepared by: Gillermo Murdoch   Exercises  Supine Quad Set - 10 reps - 1 sets - 10sec hold - 2x daily - 7x weekly  Small Range Straight Leg Raise - 10 reps - 1 sets - 5 sec hold - 2x daily - 7x weekly  Supine Heel Slide with Strap - 10 reps - 1 sets - 10 sec hold - 2x daily - 7x weekly  Seated Knee Flexion Slide - 5-10 reps - 1 sets - 10 sec hold - 2x daily - 7x weekly  Hooklying Hamstring Stretch with Strap - 3 reps - 1 sets - 30 sec hold - 2x daily - 7x weekly  Standing Weight Shift - 3 reps - 1 sets - 30 sec hold - 2x daily - 7x weekly  Half Sun Salutation Flow with Feet Apart, Knees Bent, Midline Dive, Hand Together, Hands to shins and Flat back - 5 reps - 1 sets - 2x daily - 7x weekly  Prone Quadriceps Set - 10 reps - 1-2 sets - 5-10 sec hold - 2x daily - 7x weekly  Prone Quadriceps Stretch with Strap - 3 reps - 1 sets - 30 sec hold - 2x daily - 7x weekly  Seated Hip Flexor Stretch - 3 reps - 1 sets - 30 sec hold - 2x daily - 7x weekly  Prone Hip Extension - 5-10 reps - 1 sets - 2-3 sec hold - 2x daily - 7x weekly  Standing Hip Extension at Wall - 10 reps - 1 sets - 2-3 sec hold - 2x daily - 7x weekly  Standing Hip Abduction - 10 reps - 1 sets - 2-3 sec hold - 2x daily - 7x weekly  Today Hip flexor stretch  Step Up - 3 reps - 1 sets - 30 sec hold - 2x daily - 7x weekly
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
## 2018-12-18 ENCOUNTER — Ambulatory Visit: Payer: BC Managed Care – PPO | Admitting: Family Medicine

## 2018-12-19 ENCOUNTER — Ambulatory Visit (INDEPENDENT_AMBULATORY_CARE_PROVIDER_SITE_OTHER): Payer: BC Managed Care – PPO | Admitting: Rehabilitative and Restorative Service Providers"

## 2018-12-19 ENCOUNTER — Encounter: Payer: Self-pay | Admitting: Rehabilitative and Restorative Service Providers"

## 2018-12-19 ENCOUNTER — Other Ambulatory Visit: Payer: Self-pay

## 2018-12-19 DIAGNOSIS — M25552 Pain in left hip: Secondary | ICD-10-CM | POA: Diagnosis not present

## 2018-12-19 DIAGNOSIS — R2689 Other abnormalities of gait and mobility: Secondary | ICD-10-CM | POA: Diagnosis not present

## 2018-12-19 DIAGNOSIS — M6281 Muscle weakness (generalized): Secondary | ICD-10-CM | POA: Diagnosis not present

## 2018-12-19 DIAGNOSIS — R29898 Other symptoms and signs involving the musculoskeletal system: Secondary | ICD-10-CM | POA: Diagnosis not present

## 2018-12-19 NOTE — Patient Instructions (Addendum)
Lying on back help hip and knee bend toward chest  Can use strap behind thigh to help bend hip  Try to lie on back with hips bent to a good stretch and hold for 2-5 minutes   Sitting keep back straight and bend forward from hips  Hold 20-30-40 seconds  Relax and repeat    Trigger Point Dry Needling  . What is Trigger Point Dry Needling (DN)? o DN is a physical therapy technique used to treat muscle pain and dysfunction. Specifically, DN helps deactivate muscle trigger points (muscle knots).  o A thin filiform needle is used to penetrate the skin and stimulate the underlying trigger point. The goal is for a local twitch response (LTR) to occur and for the trigger point to relax. No medication of any kind is injected during the procedure.   . What Does Trigger Point Dry Needling Feel Like?  o The procedure feels different for each individual patient. Some patients report that they do not actually feel the needle enter the skin and overall the process is not painful. Very mild bleeding may occur. However, many patients feel a deep cramping in the muscle in which the needle was inserted. This is the local twitch response.   Marland Kitchen How Will I feel after the treatment? o Soreness is normal, and the onset of soreness may not occur for a few hours. Typically this soreness does not last longer than two days.  o Bruising is uncommon, however; ice can be used to decrease any possible bruising.  o In rare cases feeling tired or nauseous after the treatment is normal. In addition, your symptoms may get worse before they get better, this period will typically not last longer than 24 hours.   . What Can I do After My Treatment? o Increase your hydration by drinking more water for the next 24 hours. o You may place ice or heat on the areas treated that have become sore, however, do not use heat on inflamed or bruised areas. Heat often brings more relief post needling. o You can continue your regular  activities, but vigorous activity is not recommended initially after the treatment for 24 hours. o DN is best combined with other physical therapy such as strengthening, stretching, and other therapies.
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
## 2018-12-31 ENCOUNTER — Ambulatory Visit (INDEPENDENT_AMBULATORY_CARE_PROVIDER_SITE_OTHER): Payer: BC Managed Care – PPO | Admitting: Physical Therapy

## 2018-12-31 ENCOUNTER — Other Ambulatory Visit: Payer: Self-pay

## 2018-12-31 DIAGNOSIS — R2689 Other abnormalities of gait and mobility: Secondary | ICD-10-CM | POA: Diagnosis not present

## 2018-12-31 DIAGNOSIS — M6281 Muscle weakness (generalized): Secondary | ICD-10-CM | POA: Diagnosis not present

## 2018-12-31 DIAGNOSIS — R29898 Other symptoms and signs involving the musculoskeletal system: Secondary | ICD-10-CM

## 2018-12-31 DIAGNOSIS — M25552 Pain in left hip: Secondary | ICD-10-CM | POA: Diagnosis not present

## 2018-12-31 NOTE — Patient Instructions (Signed)
Bridging    Slowly raise buttocks from floor, keeping stomach tight. Repeat __10__ times per set. Do _1-2___ sets per session. Do __1__ sessions per day.  Hip Extension (Prone)    Lift left leg _1-2___ inches from floor, keeping knee locked. Repeat _10___ times per set. Do __1-2__ sets per session. Do __1__ sessions per day.
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
## 2019-01-03 ENCOUNTER — Encounter: Payer: BC Managed Care – PPO | Admitting: Physical Therapy

## 2019-01-11 ENCOUNTER — Encounter: Payer: Self-pay | Admitting: Physician Assistant

## 2019-01-11 DIAGNOSIS — F411 Generalized anxiety disorder: Secondary | ICD-10-CM

## 2019-01-11 DIAGNOSIS — F988 Other specified behavioral and emotional disorders with onset usually occurring in childhood and adolescence: Secondary | ICD-10-CM

## 2019-01-11 MED ORDER — ALPRAZOLAM 0.5 MG PO TABS: 0.5000 mg | ORAL_TABLET | Freq: Once | ORAL | 0 refills | Status: DC | PRN

## 2019-01-11 MED ORDER — LISDEXAMFETAMINE DIMESYLATE 50 MG PO CAPS: 50.0000 mg | ORAL_CAPSULE | Freq: Every day | ORAL | 0 refills | Status: DC

## 2019-01-14 ENCOUNTER — Encounter: Payer: BC Managed Care – PPO | Admitting: Rehabilitative and Restorative Service Providers"

## 2019-01-18 ENCOUNTER — Ambulatory Visit: Payer: BC Managed Care – PPO | Admitting: Family Medicine

## 2019-01-18 ENCOUNTER — Encounter: Payer: Self-pay | Admitting: Family Medicine

## 2019-01-18 ENCOUNTER — Other Ambulatory Visit: Payer: Self-pay

## 2019-01-18 VITALS — BP 144/87 | HR 93 | Temp 98.0°F | Ht 66.0 in | Wt 194.0 lb

## 2019-01-18 DIAGNOSIS — M25562 Pain in left knee: Secondary | ICD-10-CM | POA: Diagnosis not present

## 2019-01-18 DIAGNOSIS — Z23 Encounter for immunization: Secondary | ICD-10-CM

## 2019-01-18 DIAGNOSIS — Z96642 Presence of left artificial hip joint: Secondary | ICD-10-CM
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
## 2019-01-18 NOTE — Patient Instructions (Signed)
Thank you for coming in today. Get xray on or After Sep 18th.  Get them to make a CD of the images  Recheck with me as needed.  I will be moving to full time Sports Medicine in [place] starting on November 1st.  You will still be able to see me for your Sports Medicine or Orthopedic needs in the [facility] Location. I will still be part of [place].   [place], [place] [address] [phone]  Telephone (phone line will be functional starting in November).  [phone] Fax [phone] Concussion Line  If you want to stay locally for your Sports Medicine issues Dr. Dianah Field here in [place] will be happy to see you.  Additionally Dr. Clearance Coots at [facility] will be happy to see you for sports medicine issues more locally.   For your primary care needs you are welcome to establish care with Dr. Emeterio Reeve.  We are working quickly to hire more physicians to cover the primary care needs however if you cannot get an appointment with Dr. Sheppard Coil in a timely manner [place] has locations and openings for primary care services nearby.   [facility] [facility] at [facility] [address] . [place] , [place]: [phone] . Behavioral Medicine: [phone] . Fax: [place] at [employer] [address] . [place], [place]: [phone] . Behavioral Medicine: [phone] . Fax: [phone] . Hours (M-F): 7am - Academic librarian At [facility]. [place], [place]: [phone] . Behavioral Medicine: [phone] . Fax: [phone] . Hours (M-F): 8am - Optician, dispensing at [employer] . [place], [place] Phone: [phone] . Behavioral Medicine: [phone] . Fax: [phone]

## 2019-01-29 ENCOUNTER — Telehealth: Payer: Self-pay | Admitting: Family Medicine

## 2019-01-29 NOTE — Telephone Encounter (Signed)
Get hip x-ray like we discussed today or later and get it put on a CD to send to your orthopedic surgeon.

## 2019-01-29 NOTE — Telephone Encounter (Signed)
Pt advised, no further questions.

## 2019-01-29 NOTE — Telephone Encounter (Signed)
-----   Message from Gregor Hams, MD sent at 11/08/2018  7:03 AM EDT ----- Regarding: Order x-ray Get postop hip x-ray on or after 01/29/2019 and send to orthopedic surgery via  image format. Patient has order form scanned into chart.

## 2019-02-07 ENCOUNTER — Ambulatory Visit (INDEPENDENT_AMBULATORY_CARE_PROVIDER_SITE_OTHER): Payer: BC Managed Care – PPO

## 2019-02-07 ENCOUNTER — Other Ambulatory Visit: Payer: Self-pay

## 2019-02-07 DIAGNOSIS — Z96642 Presence of left artificial hip joint: Secondary | ICD-10-CM

## 2019-02-22 ENCOUNTER — Encounter: Payer: Self-pay | Admitting: Physician Assistant

## 2019-02-22 DIAGNOSIS — F988 Other specified behavioral and emotional disorders with onset usually occurring in childhood and adolescence: Secondary | ICD-10-CM

## 2019-02-25 MED ORDER — LISDEXAMFETAMINE DIMESYLATE 50 MG PO CAPS: 50.0000 mg | ORAL_CAPSULE | Freq: Every day | ORAL | 0 refills | Status: DC

## 2019-04-10 ENCOUNTER — Encounter: Payer: Self-pay | Admitting: Physician Assistant

## 2019-04-10 ENCOUNTER — Ambulatory Visit: Payer: BC Managed Care – PPO | Admitting: Sports Medicine

## 2019-04-10 ENCOUNTER — Telehealth: Payer: Self-pay | Admitting: Physician Assistant

## 2019-04-10 ENCOUNTER — Telehealth: Payer: Self-pay

## 2019-04-10 DIAGNOSIS — F411 Generalized anxiety disorder: Secondary | ICD-10-CM

## 2019-04-10 DIAGNOSIS — F988 Other specified behavioral and emotional disorders with onset usually occurring in childhood and adolescence: Secondary | ICD-10-CM

## 2019-04-10 NOTE — Telephone Encounter (Signed)
Pt sent a message via mychart stating that a co-worker tested positive for COVID-19 and he wants a Rapid test.

## 2019-04-10 NOTE — Telephone Encounter (Signed)
Patient was scheduled today to see Dr T to evaluate leg pain post hip replacement in [place] (see notes with Dr Georgina Snell concerning leg pain). Patient states he cannot tell if leg pain is coming from muscle or bone. Patient not on blood thinners, states he stopped his baby ASA after his flight home from [place]. Patient denied redness, streaking, or swelling in the area.   Patient also notes that he was exposed to a coworker on Saturday who tested positive for COVID (received results yesterday). He has not had any SX yet but very anxious and wants to be rapid tested.  I called and advised patient that due to [place] with leg, he needs to be seen. Advised him of concerns for possible blood clot or infection. Also advised patient that since he was exposed to [employer], he cannot be seen here in office.   Advised patient to be evaluated in ED. Patient agreeable and has someone who can take him. He will present to ED today.   FYI to provider

## 2019-04-10 NOTE — Telephone Encounter (Signed)
Addressed with pt and documented in separate phone note.

## 2019-04-11 MED ORDER — ALPRAZOLAM 0.5 MG PO TABS: 0.5000 mg | ORAL_TABLET | Freq: Once | ORAL | 0 refills | Status: DC | PRN

## 2019-04-11 MED ORDER — LISDEXAMFETAMINE DIMESYLATE 50 MG PO CAPS: 50.0000 mg | ORAL_CAPSULE | Freq: Every day | ORAL | 0 refills | Status: DC

## 2019-04-11 NOTE — Telephone Encounter (Signed)
Thank you :)

## 2019-04-17 ENCOUNTER — Other Ambulatory Visit: Payer: Self-pay

## 2019-04-17 ENCOUNTER — Encounter: Payer: Self-pay | Admitting: Sports Medicine

## 2019-04-17 ENCOUNTER — Ambulatory Visit (INDEPENDENT_AMBULATORY_CARE_PROVIDER_SITE_OTHER): Payer: BC Managed Care – PPO

## 2019-04-17 ENCOUNTER — Ambulatory Visit: Payer: BC Managed Care – PPO | Admitting: Sports Medicine

## 2019-04-17 DIAGNOSIS — Z96642 Presence of left artificial hip joint: Secondary | ICD-10-CM

## 2019-04-17 MED ORDER — GABAPENTIN 300 MG PO CAPS: ORAL_CAPSULE | ORAL | 3 refills | Status: DC
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
## 2019-04-17 NOTE — Assessment & Plan Note (Signed)
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 This was done in [facility], no cement in the femoral stem. He is now starting to have pain in the mid femur, easily reproducible with exam, I am concerned for a loose femoral neck. Adding x-rays and a three-phase bone scan. He is having some paresthesias on the lateral mid thigh, I think this is likely postop from retraction of surrounding tissues. Adding gabapentin for this.

## 2019-04-22 ENCOUNTER — Encounter: Payer: Self-pay | Admitting: Sports Medicine

## 2019-04-22 NOTE — Telephone Encounter (Signed)
Reached out to [facility], no auth was required for this.   I have contacted centralized scheduling and they have the order.  Called to update patient but no answer and VM full. I sent him a MyChart msg advising him that he should call centralized scheduling at [phone] to get set up for this.

## 2019-04-22 NOTE — Telephone Encounter (Signed)
Kim, what the hell, did you not even read my note?!?  I ordered a 3 phase bone scan, this is a nuclear medicine scan to look for loosening of a joint arthroplasty.  This needs to be scheduled at [place] would you please look into this for me, and reassure the patient that we are not talking about a bone density test, we are talking about a three-phase bone scan as he and I discussed in detail in the office visit.

## 2019-04-29 ENCOUNTER — Encounter (HOSPITAL_COMMUNITY)
Admission: RE | Admit: 2019-04-29 | Discharge: 2019-04-29 | Disposition: A | Discharge status: Home / Self Care | Payer: BC Managed Care – PPO | Source: Ambulatory Visit | Attending: Sports Medicine | Admitting: Sports Medicine

## 2019-04-29 ENCOUNTER — Other Ambulatory Visit: Payer: Self-pay

## 2019-04-29 DIAGNOSIS — Z96642 Presence of left artificial hip joint: Secondary | ICD-10-CM | POA: Insufficient documentation

## 2019-04-29 MED ORDER — TECHNETIUM TC 99M MEDRONATE IV KIT
20.0000 | PACK | Freq: Once | INTRAVENOUS | Status: AC | PRN
  Administered 2019-04-29: 20 via INTRAVENOUS

## 2019-06-12 ENCOUNTER — Ambulatory Visit (INDEPENDENT_AMBULATORY_CARE_PROVIDER_SITE_OTHER): Payer: BC Managed Care – PPO | Admitting: Medical-Surgical

## 2019-06-12 ENCOUNTER — Encounter: Payer: Self-pay | Admitting: Medical-Surgical

## 2019-06-12 VITALS — BP 144/87 | HR 93 | Temp 98.0°F | Ht 66.0 in | Wt 198.0 lb

## 2019-06-12 DIAGNOSIS — F411 Generalized anxiety disorder: Secondary | ICD-10-CM | POA: Diagnosis not present

## 2019-06-12 DIAGNOSIS — F988 Other specified behavioral and emotional disorders with onset usually occurring in childhood and adolescence: Secondary | ICD-10-CM

## 2019-06-12 MED ORDER — LISDEXAMFETAMINE DIMESYLATE 50 MG PO CAPS: 50.0000 mg | ORAL_CAPSULE | Freq: Every day | ORAL | 0 refills | Status: DC

## 2019-06-12 MED ORDER — ALPRAZOLAM 0.5 MG PO TABS: 0.5000 mg | ORAL_TABLET | Freq: Every day | ORAL | 2 refills | Status: DC | PRN

## 2019-06-12 NOTE — Assessment & Plan Note (Signed)
Continue Vyvanse 50 mg daily 

## 2019-06-12 NOTE — Progress Notes (Addendum)
Virtual Visit via Video Note  I connected with Henry Marshall on 06/12/19 at 11:10 AM EST by a video enabled telemedicine application and verified that I am speaking with the correct person using two identifiers.   I discussed the limitations of evaluation and management by telemedicine and the availability of in person appointments. The patient expressed understanding and agreed to proceed.  Subjective:    CC: ADD and anxiety follow up  HPI: 39 year old male presenting for follow up on ADD and anxiety.  ADD: Taking Vyvanse 50 mg daily, more workdays, effective.  No difficulty sleeping, no weight change.  GAD: Taking Xanax 0.5 mg daily, usually in the morning to help with work anxiety, effective.   Past medical history, Surgical history, Family history not pertinant except as noted below, Social history, Allergies, and medications have been entered into the medical record, reviewed, and corrections made.   Review of Systems: No fevers, chills, night sweats, weight loss, chest pain, or shortness of breath.   Depression screen [facility] 2/9 06/12/2019 10/18/2018 07/20/2018 06/05/2017  Decreased Interest 0 0 0 0  Down, Depressed, Hopeless 0 0 0 0  PHQ - 2 Score 0 0 0 0  Altered sleeping 1 1 1  -  Tired, decreased energy 0 0 0 -  Change in appetite 0 0 0 -  Feeling bad or failure about yourself  0 0 0 -  Trouble concentrating 1 1 0 -  Moving slowly or fidgety/restless 0 0 0 -  Suicidal thoughts 0 0 0 -  PHQ-9 Score 2 2 1  -  Difficult doing work/chores Not difficult at all - Somewhat difficult -   GAD 7 : Generalized Anxiety Score 06/12/2019 10/18/2018 07/20/2018  Nervous, Anxious, on Edge 0 0 1  Control/stop worrying 0 0 0  Worry too much - different things 0 1 0  Trouble relaxing 0 1 1  Restless 1 0 0  Easily annoyed or irritable 0 1 1  Afraid - awful might happen 0 0 0  Total GAD 7 Score 1 3 3   Anxiety Difficulty Not difficult at all - Somewhat difficult      Objective:    General:  Speaking clearly in complete sentences without any shortness of breath.  Alert and oriented x3.  Normal judgment. No apparent acute distress.    Impression and Recommendations:    Attention deficit disorder (ADD) in adult Continue Vyvanse 50 mg daily.  GAD (generalized anxiety disorder) Continue Xanax 0.5 mg daily as needed.  Patient is aware of risks and benefits of being on benzodiazepines.    Return in about 6 months (around 12/10/2019) for ADD/anxiety follow-up.   I discussed the assessment and treatment plan with the patient. The patient was provided an opportunity to ask questions and all were answered. The patient agreed with the plan and demonstrated an understanding of the instructions.   The patient was advised to call back or seek an in-person evaluation if the symptoms worsen or if the condition fails to improve as anticipated.  25 minutes of non-face-to-face time was provided during this encounter.   07/22/2018, DNP, APRN, FNP-BC [facility] Behavioral Health and Sports Medicine

## 2019-06-12 NOTE — Assessment & Plan Note (Addendum)
Continue Xanax 0.5 mg daily as needed.  Patient is aware of risks and benefits of being on benzodiazepines.

## 2019-06-14 ENCOUNTER — Telehealth: Payer: Self-pay | Admitting: Medical-Surgical

## 2019-06-14 NOTE — Telephone Encounter (Signed)
Received a fax from [facility] that Vyvanse 50 mg was approved from 06/13/2019 through 06/12/2020. Pharmacy aware and forms sent to scan.

## 2019-07-20 ENCOUNTER — Encounter: Payer: Self-pay | Admitting: Physician Assistant

## 2019-07-21 ENCOUNTER — Encounter: Payer: Self-pay | Admitting: Physician Assistant

## 2019-09-20 ENCOUNTER — Encounter: Payer: Self-pay | Admitting: Physician Assistant

## 2019-09-20 DIAGNOSIS — F411 Generalized anxiety disorder: Secondary | ICD-10-CM

## 2019-09-20 MED ORDER — ALPRAZOLAM 0.5 MG PO TABS: 0.5000 mg | ORAL_TABLET | Freq: Every day | ORAL | 2 refills | Status: DC | PRN

## 2019-09-20 NOTE — Telephone Encounter (Signed)
Was a [facility] patient but looks like you saw him last for ADD/Anxiety.   Did patient transfer care to you? If so, I will add you as his PCP.   I pended refill, please advise

## 2019-10-14 ENCOUNTER — Encounter: Payer: Self-pay | Admitting: Physician Assistant
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
## 2019-10-15 ENCOUNTER — Encounter: Payer: Self-pay | Admitting: Medical-Surgical

## 2019-10-15 ENCOUNTER — Other Ambulatory Visit: Payer: Self-pay

## 2019-10-15 ENCOUNTER — Ambulatory Visit: Payer: BC Managed Care – PPO | Admitting: Medical-Surgical

## 2019-10-15 ENCOUNTER — Ambulatory Visit (INDEPENDENT_AMBULATORY_CARE_PROVIDER_SITE_OTHER): Payer: BC Managed Care – PPO

## 2019-10-15 VITALS — BP 137/85 | HR 108 | Temp 98.3°F | Ht 65.0 in | Wt 216.2 lb

## 2019-10-15 DIAGNOSIS — M25522 Pain in left elbow: Secondary | ICD-10-CM

## 2019-10-15 DIAGNOSIS — M542 Cervicalgia: Secondary | ICD-10-CM

## 2019-10-15 DIAGNOSIS — F988 Other specified behavioral and emotional disorders with onset usually occurring in childhood and adolescence: Secondary | ICD-10-CM

## 2019-10-15 DIAGNOSIS — F411 Generalized anxiety disorder: Secondary | ICD-10-CM

## 2019-10-15 DIAGNOSIS — F1320 Sedative, hypnotic or anxiolytic dependence, uncomplicated: Secondary | ICD-10-CM | POA: Diagnosis not present

## 2019-10-15 DIAGNOSIS — F902 Attention-deficit hyperactivity disorder, combined type: Secondary | ICD-10-CM

## 2019-10-15 MED ORDER — LISDEXAMFETAMINE DIMESYLATE 50 MG PO CAPS: 50.0000 mg | ORAL_CAPSULE | Freq: Every day | ORAL | 0 refills | Status: DC

## 2019-10-15 NOTE — Telephone Encounter (Signed)
Patient was seen today.

## 2019-10-27 ENCOUNTER — Encounter: Payer: Self-pay | Admitting: Family Medicine

## 2019-10-31 ENCOUNTER — Ambulatory Visit: Payer: BC Managed Care – PPO | Admitting: Family Medicine

## 2019-10-31 ENCOUNTER — Other Ambulatory Visit: Payer: Self-pay

## 2019-10-31 ENCOUNTER — Encounter: Payer: Self-pay | Admitting: Family Medicine

## 2019-10-31 ENCOUNTER — Ambulatory Visit (INDEPENDENT_AMBULATORY_CARE_PROVIDER_SITE_OTHER): Payer: BC Managed Care – PPO

## 2019-10-31 ENCOUNTER — Ambulatory Visit: Payer: Self-pay

## 2019-10-31 VITALS — BP 132/84 | HR 81 | Ht 65.0 in | Wt 214.0 lb

## 2019-10-31 DIAGNOSIS — M25552 Pain in left hip: Secondary | ICD-10-CM

## 2019-10-31 DIAGNOSIS — M25551 Pain in right hip: Secondary | ICD-10-CM

## 2019-10-31 NOTE — Patient Instructions (Signed)
Thank you for coming in today. Plan for xray hip today.   For elbow I think it is tennis elbow mostly.  Plan for stretch and strength.  Consider counterforce tennis elbow strap or wrist brace.  Do the exercises. Remember to keep the elbow straight.  Also consider theraband flexbar.   Try voltaren gel up to 4x daily  Tennis Elbow Rehab Ask your health care provider which exercises are safe for you. Do exercises exactly as told by your health care provider and adjust them as directed. It is normal to feel mild stretching, pulling, tightness, or discomfort as you do these exercises. Stop right away if you feel sudden pain or your pain gets worse. Do not begin these exercises until told by your health care provider. Stretching and range-of-motion exercises These exercises warm up your muscles and joints and improve the movement and flexibility of your elbow. These exercises also help to relieve pain, numbness, and tingling. Wrist flexion, assisted  1. Straighten your left / right elbow in front of you with your palm facing down toward the floor. ? If told by your health care provider, bend your left / right elbow to a 90-degree angle (right angle) at your side. 2. With your other hand, gently push over the back of your left / right hand so your fingers point toward the floor (flexion). Stop when you feel a gentle stretch on the back of your forearm. 3. Hold this position for __________ seconds. Repeat __________ times. Complete this exercise __________ times a day. Wrist extension, assisted  1. Straighten your left / right elbow in front of you with your palm facing up toward the ceiling. ? If told by your health care provider, bend your left / right elbow to a 90-degree angle (right angle) at your side. 2. With your other hand, gently pull your left / right hand and fingers toward the floor (extension). Stop when you feel a gentle stretch on the palm side of your forearm. 3. Hold this  position for __________ seconds. Repeat __________ times. Complete this exercise __________ times a day. Assisted forearm rotation, supination 1. Sit or stand with your left / right elbow bent to a 90-degree angle (right angle) at your side. 2. Using your uninjured hand, turn (rotate) your left / right palm up toward the ceiling (supination) until you feel a gentle stretch along the inside of your forearm. 3. Hold this position for __________ seconds. Repeat __________ times. Complete this exercise __________ times a day. Assisted forearm rotation, pronation 1. Sit or stand with your left / right elbow bent to a 90-degree angle (right angle) at your side. 2. Using your uninjured hand, rotate your left / right palm down toward the floor (pronation) until you feel a gentle stretch along the outside of your forearm. 3. Hold this position for __________ seconds. Repeat __________ times. Complete this exercise __________ times a day. Strengthening exercises These exercises build strength and endurance in your forearm and elbow. Endurance is the ability to use your muscles for a long time, even after they get tired. Radial deviation  1. Stand with a __________ weight or a hammer in your left / right hand. Or, sit while holding a rubber exercise band or tubing, with your left / right forearm supported on a table or countertop. ? If you are standing, position your forearm so that your thumb is facing forward. If you are sitting, position your forearm so that the thumb is facing the ceiling. This is  the neutral position. 2. Raise your hand upward in front of you so your thumb moves toward the ceiling (radial deviation), or pull up on the rubber tubing. Keep your forearm and elbow still while you move your wrist only. 3. Hold this position for __________ seconds. 4. Slowly return to the starting position. Repeat __________ times. Complete this exercise __________ times a day. Wrist extension,  eccentric 1. Sit with your left / right forearm palm-down and supported on a table or other surface. Let your left / right wrist extend over the edge of the surface. 2. Hold a __________ weight or a piece of exercise band or tubing in your left / right hand. ? If using a rubber exercise band or tubing, hold the other end of the tubing with your other hand. 3. Use your uninjured hand to move your left / right hand up toward the ceiling. 4. Take your uninjured hand away and slowly return to the starting position using only your left / right hand. Lowering your arm under tension is called eccentric extension. Repeat __________ times. Complete this exercise __________ times a day. Wrist extension Do not do this exercise if it causes pain at the outside of your elbow. Only do this exercise once instructed by your health care provider. 1. Sit with your left / right forearm supported on a table or other surface and your palm turned down toward the floor. Let your left / right wrist extend over the edge of the surface. 2. Hold a __________ weight or a piece of rubber exercise band or tubing. ? If you are using a rubber exercise band or tubing, hold the band or tubing in place with your other hand to provide resistance. 3. Slowly bend your wrist so your hand moves up toward the ceiling (extension). Move only your wrist, keeping your forearm and elbow still. 4. Hold this position for __________ seconds. 5. Slowly return to the starting position. Repeat __________ times. Complete this exercise __________ times a day. Forearm rotation, supination To do this exercise, you will need a lightweight hammer or rubber mallet. 1. Sit with your left / right forearm supported on a table or other surface. Bend your elbow to a 90-degree angle (right angle). Position your forearm so that your palm is facing down toward the floor, with your hand resting over the edge of the table. 2. Hold a hammer in your left / right  hand. ? To make this exercise easier, hold the hammer near the head of the hammer. ? To make this exercise harder, hold the hammer near the end of the handle. 3. Without moving your wrist or elbow, slowly rotate your forearm so your palm faces up toward the ceiling (supination). 4. Hold this position for __________ seconds. 5. Slowly return to the starting position. Repeat __________ times. Complete this exercise __________ times a day. Shoulder blade squeeze 1. Sit in a stable chair or stand with good posture. If you are sitting down, do not let your back touch the back of the chair. 2. Your arms should be at your sides with your elbows bent to a 90-degree angle (right angle). Position your forearms so that your thumbs are facing the ceiling (neutral position). 3. Without lifting your shoulders up, squeeze your shoulder blades tightly together. 4. Hold this position for __________ seconds. 5. Slowly release and return to the starting position. Repeat __________ times. Complete this exercise __________ times a day. This information is not intended to replace advice given to  you by your health care provider. Make sure you discuss any questions you have with your health care provider. Document Revised: 08/30/2018 Document Reviewed: 07/03/2018 Elsevier Patient Education  2020 [employer].

## 2019-10-31 NOTE — Progress Notes (Signed)
Subjective:    CC: R Hip pain  L elbow  I, Debbe Odea, am serving as a scribe for Dr. [name].  HPI: Patient is a 39 year old male who presents to [facility] today for hip pain. 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  He had x-ray of his pelvis about 6 months ago that did show stable avascular necrosis right hip.  He notes his right hip pain has been worsening a bit recently.  L elbow pain that started 3 weeks ago.  He denies any injury.  Pain is located at the lateral aspect of his elbow worse with activity.  He notes he is developed some weakness in his hand as a result.  No significant radiating pain or numbness distally.   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   MSK: Right hip normal-appearing normal motion.. Normal elbow strength. Left elbow normal-appearing normal motion.   Tender palpation lateral epicondyle. Pain with resisted wrist extension. Pulses cap refill and sensation intact distally.  Lab and Radiology Results X-ray images right hip obtained today personally and independently reviewed Stable appearance AVN right femoral head Wait for radiology review  EXAM: LEFT ELBOW - COMPLETE 3+ VIEW  COMPARISON:  None.  FINDINGS: The mineralization and alignment are normal. There is no evidence of acute fracture or dislocation. The joint spaces are preserved. No joint effusion or focal soft tissue abnormality identified.  IMPRESSION: Normal examination.   Electronically Signed   By: Carey Bullocks M.D.   On: 10/15/2019 15:42 I, [name], personally (independently) visualized and performed the  interpretation of the images attached in this note.   Impression and Recommendations:    Assessment and Plan: 39 y.o. male with right hip pain.  Avascular necrosis.  Patient notes slightly worsening symptoms.  My interpretation of x-ray shows that the AVN looks pretty stable however radiology overread is still pending.  Advised patient that ultimately the avascular necrosis was likely to worsen and he ultimately will probably need a hip replacement.  Can proceed with further testing closer to that time if needed.  Left elbow pain: Lateral epicondylitis.  Plan to treat with eccentric exercises and stretching.  Recommend also Voltaren gel.  If not improving patient will notify me and we will proceed with formal physical therapy referral and possibly nitroglycerin patch protocol.Marland Kitchen  PDMP not reviewed this encounter. Orders Placed This Encounter  Procedures  . DG HIP UNILAT WITH PELVIS 2-3 VIEWS RIGHT    Standing Status:   Future    Number of Occurrences:   1    Standing Expiration Date:   10/30/2020    Order Specific Question:   Reason for Exam (SYMPTOM  OR DIAGNOSIS REQUIRED)    Answer:   eval right hip pain    Order Specific Question:   Preferred imaging location?    Answer:   [facility]    Order Specific Question:   Radiology Contrast Protocol - do NOT remove file path    Answer:   [email or web address]   No orders of the defined types were placed in this encounter.   Discussed warning  signs or symptoms. Please see discharge instructions. Patient expresses understanding.   The above documentation has been reviewed and is accurate and complete Lynne Leader, M.D.

## 2019-11-04 NOTE — Progress Notes (Signed)
X-ray right hip shows avascular necrosis of the right hip which shows some minimal femoral head collapse.

## 2019-11-08 ENCOUNTER — Encounter: Payer: Self-pay | Admitting: Family Medicine

## 2019-11-08 MED ORDER — CELECOXIB 200 MG PO CAPS: 200.0000 mg | ORAL_CAPSULE | Freq: Two times a day (BID) | ORAL | 5 refills | Status: DC

## 2019-11-14 ENCOUNTER — Encounter: Payer: Self-pay | Admitting: Medical-Surgical

## 2019-11-14 ENCOUNTER — Ambulatory Visit: Payer: BC Managed Care – PPO

## 2019-11-14 ENCOUNTER — Other Ambulatory Visit: Payer: Self-pay

## 2019-11-14 ENCOUNTER — Ambulatory Visit: Payer: BC Managed Care – PPO | Admitting: Medical-Surgical

## 2019-11-14 ENCOUNTER — Ambulatory Visit (INDEPENDENT_AMBULATORY_CARE_PROVIDER_SITE_OTHER): Payer: BC Managed Care – PPO

## 2019-11-14 ENCOUNTER — Other Ambulatory Visit: Payer: Self-pay | Admitting: Medical-Surgical

## 2019-11-14 VITALS — BP 129/86 | HR 96 | Temp 98.3°F | Ht 65.0 in | Wt 213.1 lb

## 2019-11-14 DIAGNOSIS — M542 Cervicalgia: Secondary | ICD-10-CM | POA: Diagnosis not present

## 2019-11-14 LAB — CBC WITH DIFFERENTIAL/PLATELET
Absolute Monocytes: 501 {cells}/uL (ref 200–950)
Basophils Absolute: 28 {cells}/uL (ref 0–200)
Basophils Relative: 0.5 %
Eosinophils Absolute: 99 {cells}/uL (ref 15–500)
Eosinophils Relative: 1.8 %
HCT: 43 % (ref 38.5–50.0)
Hemoglobin: 14.3 g/dL (ref 13.2–17.1)
Lymphs Abs: 1920 {cells}/uL (ref 850–3900)
MCH: 29.3 pg (ref 27.0–33.0)
MCHC: 33.3 g/dL (ref 32.0–36.0)
MCV: 88.1 fL (ref 80.0–100.0)
MPV: 9.4 fL (ref 7.5–12.5)
Monocytes Relative: 9.1 %
Neutro Abs: 2954 {cells}/uL (ref 1500–7800)
Neutrophils Relative %: 53.7 %
Platelets: 229 Thousand/uL (ref 140–400)
RBC: 4.88 Million/uL (ref 4.20–5.80)
RDW: 12.8 % (ref 11.0–15.0)
Total Lymphocyte: 34.9 %
WBC: 5.5 Thousand/uL (ref 3.8–10.8)
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
## 2019-11-18 ENCOUNTER — Ambulatory Visit (INDEPENDENT_AMBULATORY_CARE_PROVIDER_SITE_OTHER): Payer: BC Managed Care – PPO

## 2019-11-18 ENCOUNTER — Other Ambulatory Visit: Payer: Self-pay

## 2019-11-18 DIAGNOSIS — M542 Cervicalgia: Secondary | ICD-10-CM

## 2019-11-18 MED ORDER — IOPAMIDOL (ISOVUE-370) INJECTION 76%
100.0000 mL | Freq: Once | INTRAVENOUS | Status: AC | PRN
  Administered 2019-11-18: 100 mL via INTRAVENOUS

## 2019-12-31 ENCOUNTER — Other Ambulatory Visit: Payer: Self-pay | Admitting: Medical-Surgical

## 2019-12-31 DIAGNOSIS — F411 Generalized anxiety disorder: Secondary | ICD-10-CM

## 2020-01-02 ENCOUNTER — Encounter: Payer: Self-pay | Admitting: Medical-Surgical

## 2020-01-15 ENCOUNTER — Ambulatory Visit: Payer: BC Managed Care – PPO | Admitting: Medical-Surgical

## 2020-02-04 ENCOUNTER — Other Ambulatory Visit: Payer: Self-pay | Admitting: Medical-Surgical

## 2020-02-04 DIAGNOSIS — F411 Generalized anxiety disorder: Secondary | ICD-10-CM

## 2020-02-04 NOTE — Telephone Encounter (Signed)
Spoke with pt who states that he takes this on average 4 days a week and that he has about 8 tablets left at home.

## 2020-03-06 ENCOUNTER — Other Ambulatory Visit: Payer: Self-pay | Admitting: Medical-Surgical

## 2020-03-06 DIAGNOSIS — F988 Other specified behavioral and emotional disorders with onset usually occurring in childhood and adolescence: Secondary | ICD-10-CM

## 2020-03-10 ENCOUNTER — Encounter: Payer: Self-pay | Admitting: Medical-Surgical

## 2020-03-10 ENCOUNTER — Encounter: Payer: Self-pay | Admitting: Family Medicine

## 2020-03-11 ENCOUNTER — Ambulatory Visit: Payer: BC Managed Care – PPO | Admitting: Medical-Surgical

## 2020-03-20 ENCOUNTER — Encounter: Payer: Self-pay | Admitting: Medical-Surgical

## 2020-03-20 ENCOUNTER — Encounter: Payer: Self-pay | Admitting: Family Medicine

## 2020-03-20 ENCOUNTER — Other Ambulatory Visit (HOSPITAL_COMMUNITY): Payer: Self-pay | Admitting: Family

## 2020-03-20 DIAGNOSIS — U071 COVID-19: Secondary | ICD-10-CM

## 2020-03-20 NOTE — Progress Notes (Signed)
I connected by phone with Para Skeans on 03/20/2020 at 8:10 PM to discuss the potential use of a new treatment for mild to moderate COVID-19 viral infection in non-hospitalized patients.  This patient is a 39 y.o. male that meets the FDA criteria for Emergency Use Authorization of COVID monoclonal antibody casirivimab/imdevimab or bamlanivimab/eteseviamb.  Has a (+) direct SARS-CoV-2 viral test result  Has mild or moderate COVID-19   Is NOT hospitalized due to COVID-19  Is within 10 days of symptom onset  Has at least one of the high risk factor(s) for progression to severe COVID-19 and/or hospitalization as defined in EUA.  Specific high risk criteria : BMI > 25   Symptoms of fever, aches, congestion, and cough began 03/18/20.   I have spoken and communicated the following to the patient or parent/caregiver regarding COVID monoclonal antibody treatment:  1. FDA has authorized the emergency use for the treatment of mild to moderate COVID-19 in adults and pediatric patients with positive results of direct SARS-CoV-2 viral testing who are 21 years of age and older weighing at least 40 kg, and who are at high risk for progressing to severe COVID-19 and/or hospitalization.  2. The significant known and potential risks and benefits of COVID monoclonal antibody, and the extent to which such potential risks and benefits are unknown.  3. Information on available alternative treatments and the risks and benefits of those alternatives, including clinical trials.  4. Patients treated with COVID monoclonal antibody should continue to self-isolate and use infection control measures (e.g., wear mask, isolate, social distance, avoid sharing personal items, clean and disinfect "high touch" surfaces, and frequent handwashing) according to CDC guidelines.   5. The patient or parent/caregiver has the option to accept or refuse COVID monoclonal antibody treatment.  After reviewing this information with  the patient, the patient has agreed to receive one of the available covid 19 monoclonal antibodies and will be provided an appropriate fact sheet prior to infusion. Morton Stall, NP 03/20/2020 8:10 PM

## 2020-03-21 ENCOUNTER — Encounter: Payer: Self-pay | Admitting: Family

## 2020-03-21 ENCOUNTER — Other Ambulatory Visit (HOSPITAL_COMMUNITY): Payer: Self-pay

## 2020-03-21 ENCOUNTER — Ambulatory Visit (HOSPITAL_COMMUNITY)
Admission: RE | Admit: 2020-03-21 | Discharge: 2020-03-21 | Disposition: A | Discharge status: Home / Self Care | Payer: BC Managed Care – PPO | Source: Ambulatory Visit | Attending: Pulmonary Disease | Admitting: Pulmonary Disease

## 2020-03-21 DIAGNOSIS — Z6825 Body mass index (BMI) 25.0-25.9, adult: Secondary | ICD-10-CM | POA: Insufficient documentation

## 2020-03-21 DIAGNOSIS — U071 COVID-19: Secondary | ICD-10-CM | POA: Insufficient documentation

## 2020-03-21 MED ORDER — FAMOTIDINE IN NACL 20-0.9 MG/50ML-% IV SOLN: 20.0000 mg | Freq: Once | INTRAVENOUS | Status: DC | PRN

## 2020-03-21 MED ORDER — ALBUTEROL SULFATE HFA 108 (90 BASE) MCG/ACT IN AERS: 2.0000 | INHALATION_SPRAY | Freq: Once | RESPIRATORY_TRACT | Status: DC | PRN

## 2020-03-21 MED ORDER — DIPHENHYDRAMINE HCL 50 MG/ML IJ SOLN: 50.0000 mg | Freq: Once | INTRAMUSCULAR | Status: DC | PRN

## 2020-03-21 MED ORDER — SODIUM CHLORIDE 0.9 % IV SOLN: INTRAVENOUS | Status: DC | PRN

## 2020-03-21 MED ORDER — METHYLPREDNISOLONE SODIUM SUCC 125 MG IJ SOLR: 125.0000 mg | Freq: Once | INTRAMUSCULAR | Status: DC | PRN

## 2020-03-21 MED ORDER — EPINEPHRINE 0.3 MG/0.3ML IJ SOAJ: 0.3000 mg | Freq: Once | INTRAMUSCULAR | Status: DC | PRN

## 2020-03-21 MED ORDER — SODIUM CHLORIDE 0.9 % IV SOLN: Freq: Once | INTRAVENOUS | Status: AC

## 2020-03-21 NOTE — Discharge Instructions (Signed)

## 2020-03-21 NOTE — Progress Notes (Signed)
  Diagnosis: COVID-19  Physician: Dr. Patrick Wright  Procedure: [facility] Med: bamlanivimab\etesevimab infusion - Provided patient with bamlanimivab\etesevimab fact sheet for patients, parents and caregivers prior to infusion.  Complications: No immediate complications noted.  Discharge: Discharged home   Henry Marshall 03/21/2020   

## 2020-03-24 ENCOUNTER — Ambulatory Visit: Payer: BC Managed Care – PPO | Admitting: Family Medicine

## 2020-05-12 ENCOUNTER — Encounter: Payer: Self-pay | Admitting: Medical-Surgical

## 2020-05-12 ENCOUNTER — Ambulatory Visit: Payer: BC Managed Care – PPO | Admitting: Medical-Surgical

## 2020-05-12 ENCOUNTER — Other Ambulatory Visit: Payer: Self-pay

## 2020-05-12 VITALS — BP 107/75 | HR 87 | Temp 98.5°F | Ht 65.0 in | Wt 212.5 lb

## 2020-05-12 DIAGNOSIS — F411 Generalized anxiety disorder: Secondary | ICD-10-CM

## 2020-05-12 DIAGNOSIS — M5441 Lumbago with sciatica, right side: Secondary | ICD-10-CM

## 2020-05-12 DIAGNOSIS — F988 Other specified behavioral and emotional disorders with onset usually occurring in childhood and adolescence: Secondary | ICD-10-CM

## 2020-05-12 DIAGNOSIS — Z23 Encounter for immunization: Secondary | ICD-10-CM | POA: Diagnosis not present

## 2020-05-12 MED ORDER — CYCLOBENZAPRINE HCL 10 MG PO TABS: 10.0000 mg | ORAL_TABLET | Freq: Every evening | ORAL | 0 refills | Status: DC | PRN

## 2020-05-12 MED ORDER — LISDEXAMFETAMINE DIMESYLATE 50 MG PO CAPS: 50.0000 mg | ORAL_CAPSULE | Freq: Every day | ORAL | 0 refills | Status: DC

## 2020-05-12 MED ORDER — ESCITALOPRAM OXALATE 5 MG PO TABS: 5.0000 mg | ORAL_TABLET | Freq: Every day | ORAL | 3 refills | Status: DC

## 2020-05-12 MED ORDER — ALPRAZOLAM 0.5 MG PO TABS: 0.5000 mg | ORAL_TABLET | Freq: Every day | ORAL | 0 refills | Status: DC | PRN
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
## 2020-05-12 NOTE — Patient Instructions (Signed)

## 2020-06-29 ENCOUNTER — Encounter: Payer: Self-pay | Admitting: Medical-Surgical

## 2020-08-30 ENCOUNTER — Encounter: Payer: Self-pay | Admitting: Family Medicine

## 2020-10-11 ENCOUNTER — Encounter: Payer: Self-pay | Admitting: Medical-Surgical

## 2020-10-12 ENCOUNTER — Encounter: Payer: Self-pay | Admitting: Medical-Surgical

## 2020-10-13 ENCOUNTER — Encounter: Payer: Self-pay | Admitting: Medical-Surgical

## 2020-10-13 ENCOUNTER — Telehealth (INDEPENDENT_AMBULATORY_CARE_PROVIDER_SITE_OTHER): Payer: BC Managed Care – PPO | Admitting: Medical-Surgical

## 2020-10-13 DIAGNOSIS — U071 COVID-19: Secondary | ICD-10-CM | POA: Diagnosis not present

## 2020-10-13 DIAGNOSIS — F902 Attention-deficit hyperactivity disorder, combined type: Secondary | ICD-10-CM | POA: Diagnosis not present

## 2020-10-13 MED ORDER — LISDEXAMFETAMINE DIMESYLATE 50 MG PO CAPS: 50.0000 mg | ORAL_CAPSULE | Freq: Every day | ORAL | 0 refills | Status: DC

## 2020-10-13 MED ORDER — MOLNUPIRAVIR 200 MG PO CAPS
800.0000 mg | ORAL_CAPSULE | Freq: Two times a day (BID) | ORAL | 0 refills | Status: DC
Start: 2020-10-13 — End: 2021-03-05

## 2020-10-13 NOTE — Progress Notes (Signed)
Virtual Visit via Video Note  I connected with Henry Marshall on 10/13/20 at  9:50 AM EDT by a video enabled telemedicine application and verified that I am speaking with the correct person using two identifiers.   I discussed the limitations of evaluation and management by telemedicine and the availability of in person appointments. The patient expressed understanding and agreed to proceed.  Patient location: home Provider locations: office  Subjective:    CC: COVID +, refill meds  HPI: Pleasant 40 year old male presenting via MyChart video visit with reports of testing positive for COVID.  Notes that he started feeling unwell on Saturday evening and woke on Sunday with significant symptoms including body aches, sore throat, cough, sneezing, sinus congestion, headaches, diarrhea, and generalized fatigue.  He took a test on Sunday morning with positive results.  Notes that he has not had any documented fevers, chills, shortness of breath, chest pain, nausea, or vomiting.  Has been taking ibuprofen and ZzzQuil as needed with minimal benefit.   Taking Vyvanse 50 mg daily for ADHD, tolerating well without side effects.  As long as he takes his dose early enough in the day, he does not sleep well at night.  Notes if he takes it too late, he has difficulty getting to sleep.  No change in appetite or weight fluctuations.  Past medical history, Surgical history, Family history not pertinant except as noted below, Social history, Allergies, and medications have been entered into the medical record, reviewed, and corrections made.   Review of Systems: See HPI for pertinent positives and negatives.   Objective:    General: Speaking clearly in complete sentences without any shortness of breath.  Alert and oriented x3.  Normal judgment. No apparent acute distress.  Impression and Recommendations:    1. COVID-19 virus infection Discussed symptom management.  Patient is aware the oral antivirals for  COVID-19 are approved for emergency use only. He would still like to proceed so sending in Molnupiravir 800mg  BID x 5 days.  Continue symptom management using over-the-counter medications. - Molnupiravir 200 MG CAPS; Take 4 capsules (800 mg total) by mouth in the morning and at bedtime.  Dispense: 40 capsule; Refill: 0  2. Attention deficit hyperactivity disorder (ADHD), combined type Continue Vyvanse 50 mg daily.  8-month supply sent to pharmacy. - lisdexamfetamine (VYVANSE) 50 MG capsule; Take 1 capsule (50 mg total) by mouth daily.  Dispense: 30 capsule; Refill: 0 - lisdexamfetamine (VYVANSE) 50 MG capsule; Take 1 capsule (50 mg total) by mouth daily.  Dispense: 30 capsule; Refill: 0 - lisdexamfetamine (VYVANSE) 50 MG capsule; Take 1 capsule (50 mg total) by mouth daily.  Dispense: 30 capsule; Refill: 0  I discussed the assessment and treatment plan with the patient. The patient was provided an opportunity to ask questions and all were answered. The patient agreed with the plan and demonstrated an understanding of the instructions.   The patient was advised to call back or seek an in-person evaluation if the symptoms worsen or if the condition fails to improve as anticipated.  20 minutes of non-face-to-face time was provided during this encounter.  Return in about 3 months (around 01/13/2021) for ADHD follow up.  01/15/2021, DNP, APRN, FNP-BC [facility] and Sports Medicine

## 2020-10-16 ENCOUNTER — Encounter: Payer: Self-pay | Admitting: Medical-Surgical

## 2020-10-16 ENCOUNTER — Telehealth: Payer: Self-pay | Admitting: Medical-Surgical

## 2020-10-16 NOTE — Telephone Encounter (Signed)
Please contact patient to verify the first day missed from work due to COVID and the date that he plans to return to work. I have his FMLA forms filled out but need that information. He has not signed the release of information so he will need to come pick up the papers (once outside of his quarantine period) or at least come sign them so they can be faxed.   Henry Ohm, DNP, APRN, FNP-BC [facility] and Sports Medicine

## 2020-10-16 NOTE — Telephone Encounter (Signed)
Forms completed, faxed to [phone] with confirmation received. Copy to scan.

## 2020-10-16 NOTE — Telephone Encounter (Signed)
Patient responded via mychart.

## 2020-10-16 NOTE — Telephone Encounter (Signed)
LMOM for patient to call back with dates he was out of work/planning to return to work so we can complete paperwork.

## 2020-11-07 ENCOUNTER — Other Ambulatory Visit: Payer: Self-pay | Admitting: Medical-Surgical

## 2020-11-07 DIAGNOSIS — F902 Attention-deficit hyperactivity disorder, combined type: Secondary | ICD-10-CM

## 2020-11-07 DIAGNOSIS — F411 Generalized anxiety disorder: Secondary | ICD-10-CM

## 2020-11-11 MED ORDER — ALPRAZOLAM 0.5 MG PO TABS: 0.5000 mg | ORAL_TABLET | Freq: Every day | ORAL | 0 refills | Status: DC | PRN

## 2020-11-22 IMAGING — DX DG HIP (WITH OR WITHOUT PELVIS) 2-3V*L*
3 series · 3 of 3 positions shown · non-contrast
Comparison: 05/28/2018

CLINICAL DATA: History of prior left hip replacement

EXAM:
DG HIP (WITH OR WITHOUT PELVIS) 3V LEFT

[pelvis ap]
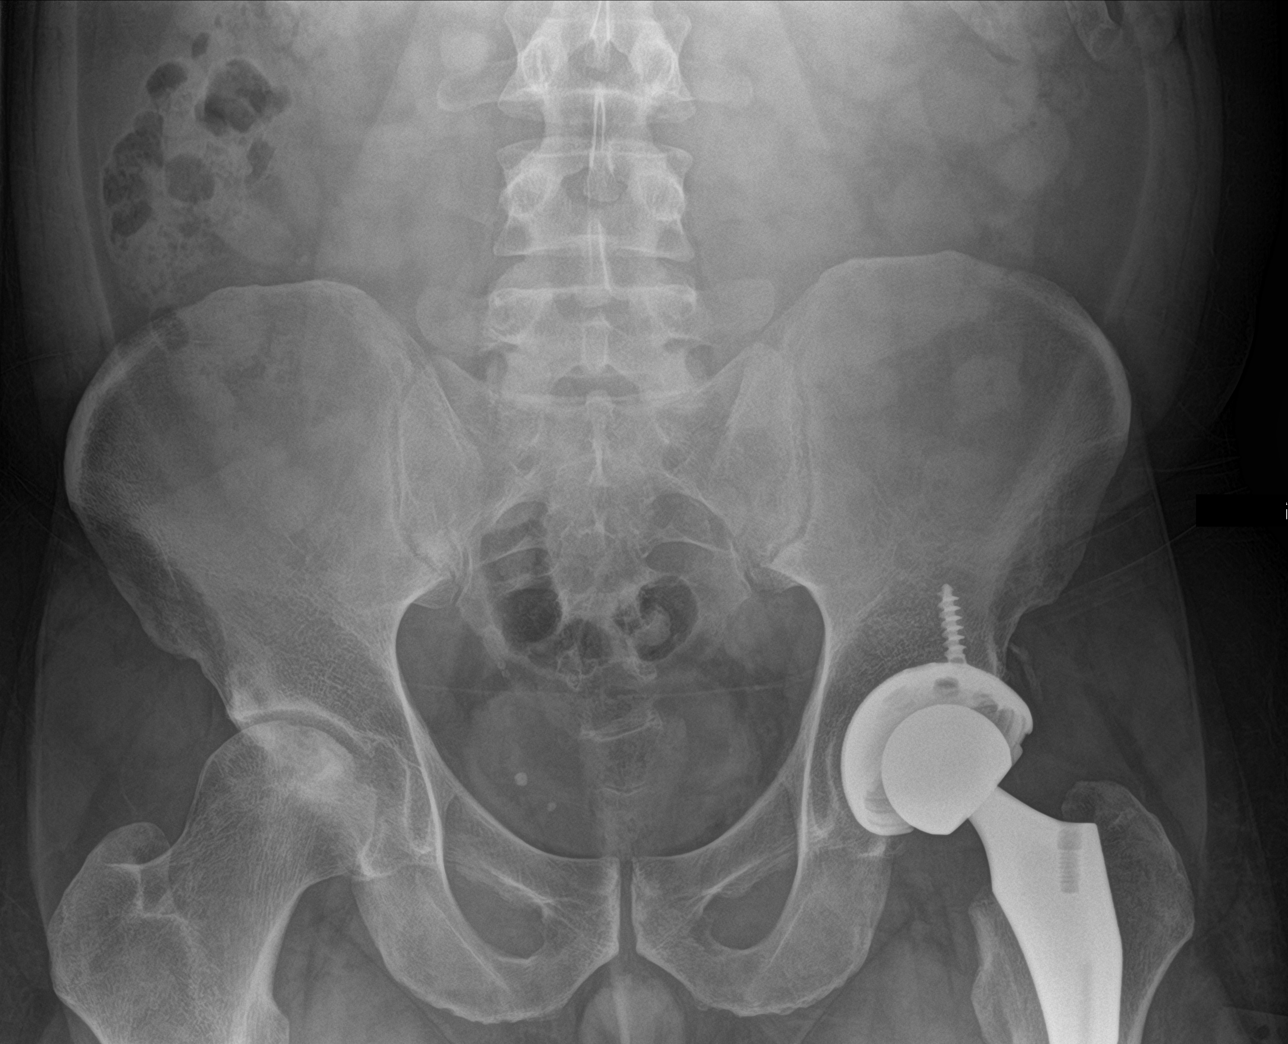

[hip ap]
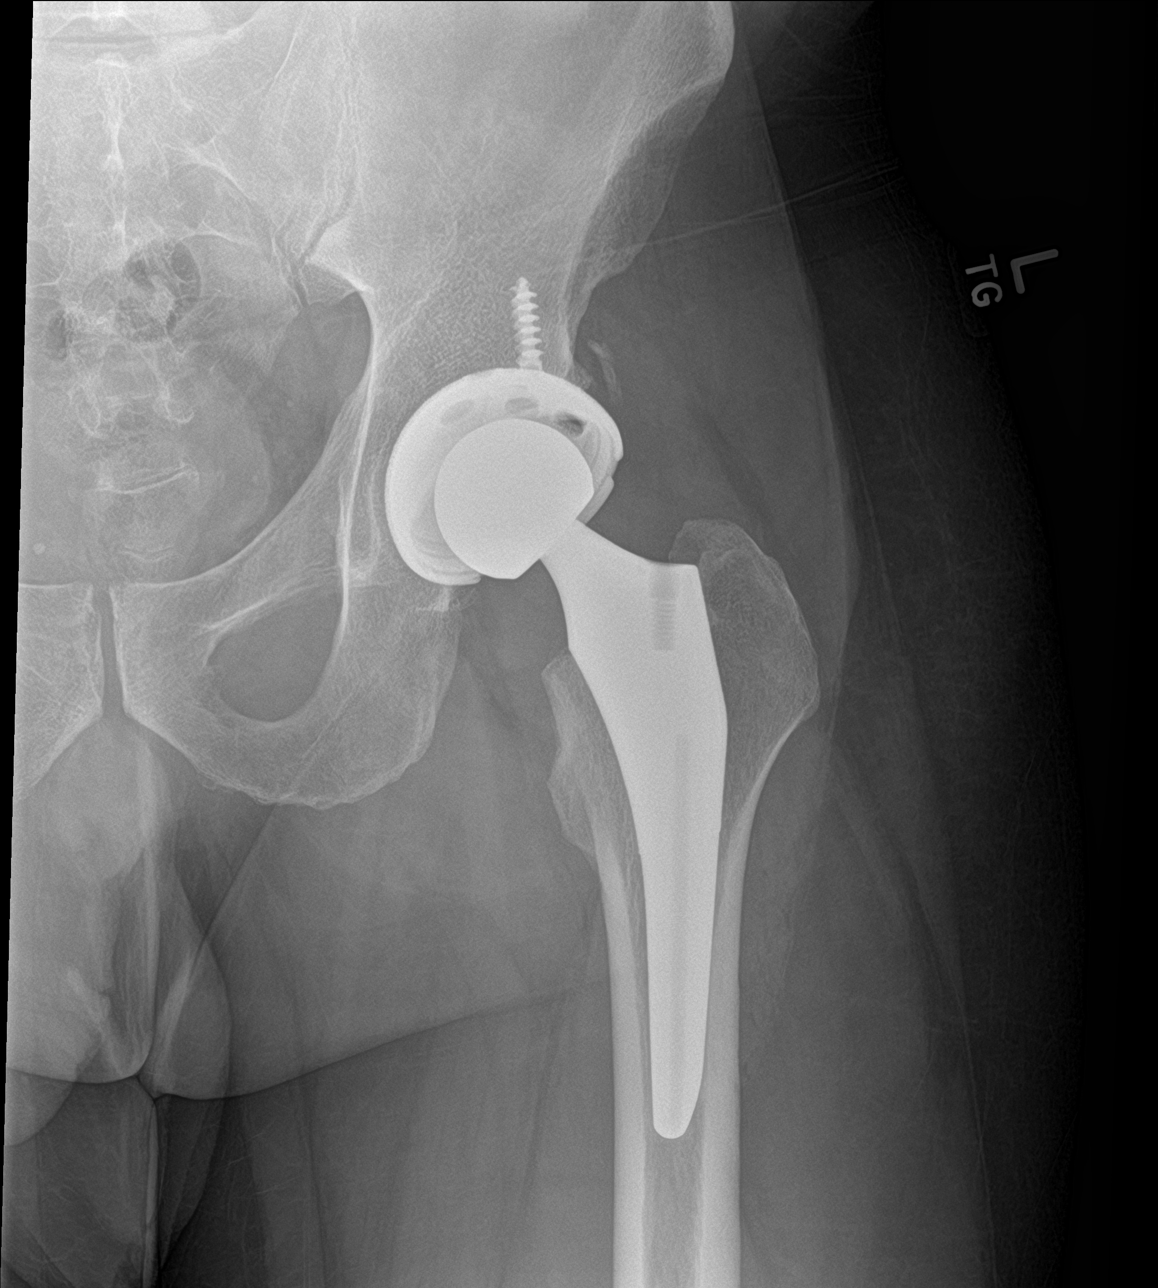

[hip frog leg]
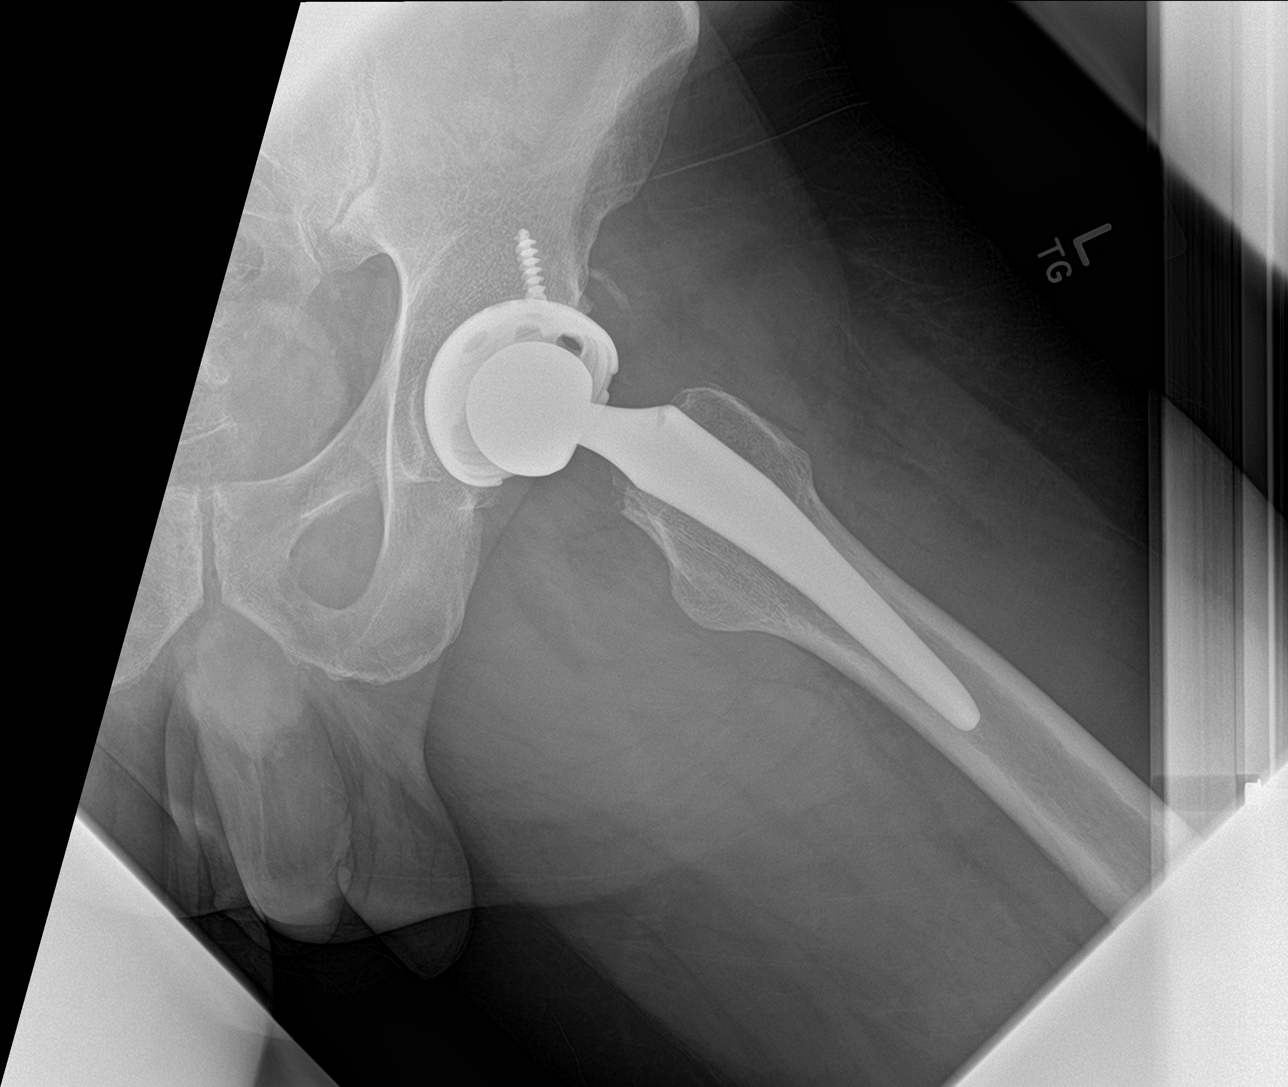

[3 of 3 positions shown; findings below may reference images not displayed]

FINDINGS: Left hip prosthesis is now seen. Changes of a vascular necrosis in
the proximal right femur are noted. No fracture or dislocation is
seen. No soft tissue abnormality is noted.
IMPRESSION: Status post left hip replacement.

Stable changes of avascular necrosis in the right femoral head.

## 2020-12-29 ENCOUNTER — Other Ambulatory Visit: Payer: Self-pay | Admitting: Medical-Surgical

## 2020-12-29 DIAGNOSIS — F411 Generalized anxiety disorder: Secondary | ICD-10-CM

## 2020-12-30 NOTE — Telephone Encounter (Signed)
Pt due for mood follow up. Pt has been notified.

## 2021-03-05 ENCOUNTER — Other Ambulatory Visit: Payer: Self-pay

## 2021-03-05 ENCOUNTER — Encounter: Payer: Self-pay | Admitting: Medical-Surgical

## 2021-03-05 ENCOUNTER — Telehealth: Payer: BC Managed Care – PPO | Admitting: Medical-Surgical

## 2021-03-05 DIAGNOSIS — F411 Generalized anxiety disorder: Secondary | ICD-10-CM

## 2021-03-05 DIAGNOSIS — F902 Attention-deficit hyperactivity disorder, combined type: Secondary | ICD-10-CM | POA: Diagnosis not present

## 2021-03-05 MED ORDER — LISDEXAMFETAMINE DIMESYLATE 50 MG PO CAPS: 50.0000 mg | ORAL_CAPSULE | Freq: Every day | ORAL | 0 refills | Status: DC

## 2021-03-05 MED ORDER — ESCITALOPRAM OXALATE 5 MG PO TABS: ORAL_TABLET | ORAL | 1 refills | Status: DC

## 2021-03-05 MED ORDER — ALPRAZOLAM 0.5 MG PO TABS: ORAL_TABLET | ORAL | 0 refills | Status: DC

## 2021-03-05 NOTE — Progress Notes (Signed)
Virtual Visit via Telephone   I connected with  Alma Muegge  on 03/05/21 by telephone/telehealth and verified that I am speaking with the correct person using two identifiers.   I discussed the limitations, risks, security and privacy concerns of performing an evaluation and management service by telephone, including the higher likelihood of inaccurate diagnosis and treatment, and the availability of in person appointments.  We also discussed the likely need of an additional face to face encounter for complete and high quality delivery of care.  I also discussed with the patient that there may be a patient responsible charge related to this service. The patient expressed understanding and wishes to proceed.  Provider location is in medical facility. Patient location is at their home, different from provider location. People involved in care of the patient during this telehealth encounter were myself, my nurse/medical assistant, and my front office/scheduling team member.  CC: ADHD/mood follow-up  HPI: Pleasant 40 year old male presenting via telephone for ADHD and mood follow-up.  Was originally scheduled as an in office visit but this was a mistake and he was supposed to be a virtual visit.  He is at work today and unable to connect via video.  ADHD-taking Vyvanse 50 mg daily, tolerating well without side effects.  Sleeping well.  No changes in appetite or weight.  No significant side effects as long as he takes his medication with food.  Feels the medication is working well for him and helping to keep him focused.  Mood-taking Lexapro 5 mg daily, tolerating well without side effects.  Feels the medication is working well for him.  Does still have some breakthrough anxiety and uses Xanax daily as needed, averaging approximately 3 uses per week.  Review of Systems: See HPI for pertinent positives and negatives.   Objective Findings:    General: Speaking full sentences, no audible heavy  breathing.  Sounds alert and appropriately interactive.    Impression and Recommendations:    1. Attention deficit hyperactivity disorder (ADHD), combined type Continue Vyvanse 50 mg daily.  Refills called in for 3 months. - lisdexamfetamine (VYVANSE) 50 MG capsule; Take 1 capsule (50 mg total) by mouth daily.  Dispense: 30 capsule; Refill: 0 - lisdexamfetamine (VYVANSE) 50 MG capsule; Take 1 capsule (50 mg total) by mouth daily.  Dispense: 30 capsule; Refill: 0 - lisdexamfetamine (VYVANSE) 50 MG capsule; Take 1 capsule (50 mg total) by mouth daily.  Dispense: 30 capsule; Refill: 0  2. GAD (generalized anxiety disorder) Continue Lexapro 5 mg daily.  Limit use of Xanax to no more than 3 times weekly.  If needing more than that, we will need to adjust his Lexapro or discuss alternatives. - ALPRAZolam (XANAX) 0.5 MG tablet; TAKE 1 TABLET BY MOUTH ONCE DAILY AS NEEDED FOR ANXIETY, LIMIT USE TO NO MORE THAN 3 TIMES WEEKLY  Dispense: 30 tablet; Refill: 0  I discussed the above assessment and treatment plan with the patient. The patient was provided an opportunity to ask questions and all were answered. The patient agreed with the plan and demonstrated an understanding of the instructions.   The patient was advised to call back or seek an in-person evaluation if the symptoms worsen or if the condition fails to improve as anticipated.  15 minutes of non-face-to-face time was provided during this encounter.  Return in about 3 months (around 06/05/2021) for ADHD follow up. ___________________________________________ Christen Butter, DNP, APRN, FNP-BC Primary Care and Sports Medicine [facility]

## 2021-03-10 ENCOUNTER — Encounter: Payer: Self-pay | Admitting: Medical-Surgical

## 2021-03-17 NOTE — Progress Notes (Signed)
I, Henry Marshall, LAT, ATC, am serving as scribe for Dr. Clementeen Marshall.  Henry Marshall is a 40 y.o. male who presents to [facility] today for L shoulder pain.  He was last seen by Dr. Denyse Marshall on 10/31/19 for R hip pain.  Today, pt reports L shoulder pain x 3 weeks w/ no known MOI.  He locates his pain to his L ACJ and superior shoulder.  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   Exam:  BP 130/86 (BP Location: Right Arm, Patient Position: Sitting, Cuff Size: Normal)   Pulse 86   Ht 5\' 5"  (1.651 m)   Wt 220 lb 6.4 oz (100 kg)   SpO2 97%   BMI 36.68 kg/m  General: Well Developed, well nourished, and in no acute distress.   MSK: C-spine: Normal-appearing Nontender midline. Tender palpation left trapezius. Normal cervical motion. Upper extremity strength is intact except noted above.  Left shoulder normal-appearing Tender palpation AC joint. Normal shoulder motion pain with abduction. Intact strength abduction external and internal rotation.  Pain with abduction. Positive Hawkins and Neer's test.  Positive crossover arm compression test. Negative Yergason's and speeds test.  Left hand and wrist: Normal-appearing Normal grip strength.  Positive Tinel's and Phalen's test at carpal tunnel.    Lab and Radiology Results  Procedure: Real-time Ultrasound Guided Injection of left shoulder subacromial bursa Device: Philips Affiniti 50G Images permanently stored and available for review in PACS Ultrasound evaluation prior to injection reveals intact  rotator cuff tendons with moderate subacromial bursitis.  Moderate AC joint effusion present. Verbal informed consent obtained.  Discussed risks and benefits of procedure. Warned about infection bleeding damage to structures skin hypopigmentation and fat atrophy among others. Patient expresses understanding and agreement Time-out conducted.   Noted no overlying erythema, induration, or other signs of local infection.   Skin prepped in a sterile fashion.   Local anesthesia: Topical Ethyl chloride.   With sterile technique and under real time ultrasound guidance: 40 mg of Kenalog and 2 mL of lidocaine injected into subacromial bursa. Fluid seen entering the bursa.   Completed without difficulty   Pain immediately resolved suggesting accurate placement of the medication.   Advised to call if fevers/chills, erythema, induration, drainage, or persistent bleeding.   Images permanently stored and available for review in the ultrasound unit.  Impression: Technically successful ultrasound guided injection.   X-ray images left shoulder obtained today personally and independently interpreted. No acute fractures.  No significant degenerative changes. Await formal radiology review    Assessment and Plan: 40 y.o. male with left shoulder pain.  Pain predominantly thought to be due to subacromial bursitis with possible AC joint effusion.  Plan for subacromial injection today and physical therapy referral.  Recheck back in about 6 weeks.  Additionally he has some paresthesias distally which are thought to be carpal tunnel syndrome.  Recommend carpal tunnel cock up wrist brace at bedtime.  Certainly could pursue gabapentin as needed at bedtime however will hold off on that medicine for now after discussion.     PDMP not reviewed this encounter. Orders Placed This Encounter  Procedures   [place] LIMITED JOINT SPACE STRUCTURES UP LEFT(NO LINKED CHARGES)    Order Specific Question:   Reason for Exam (SYMPTOM   OR DIAGNOSIS REQUIRED)    Answer:   L shoulder pain    Order Specific Question:   Preferred imaging location?    Answer:   [facility] Shoulder Left    Standing Status:   Future    Number of Occurrences:   1    Standing Expiration Date:   04/18/2021    Order Specific Question:   Reason for Exam (SYMPTOM  OR DIAGNOSIS REQUIRED)    Answer:   L shoulder pain    Order Specific Question:   Preferred imaging location?    Answer:   [facility]   Ambulatory referral to Physical Therapy    Referral Priority:   Routine    Referral Type:   Physical Medicine    Referral Reason:   Specialty Services Required    Requested Specialty:   Physical Therapy    Number of Visits Requested:   1   No orders of the defined types were placed in this encounter.    Discussed warning signs or symptoms. Please see discharge instructions. Patient expresses understanding.   The above documentation has been reviewed and is accurate and complete Henry Marshall, M.D.

## 2021-03-18 ENCOUNTER — Other Ambulatory Visit: Payer: Self-pay

## 2021-03-18 ENCOUNTER — Ambulatory Visit: Payer: Self-pay

## 2021-03-18 ENCOUNTER — Ambulatory Visit: Payer: BC Managed Care – PPO | Admitting: Family Medicine

## 2021-03-18 ENCOUNTER — Ambulatory Visit (INDEPENDENT_AMBULATORY_CARE_PROVIDER_SITE_OTHER): Payer: BC Managed Care – PPO

## 2021-03-18 ENCOUNTER — Encounter: Payer: Self-pay | Admitting: Family Medicine

## 2021-03-18 VITALS — BP 130/86 | HR 86 | Ht 65.0 in | Wt 220.4 lb

## 2021-03-18 DIAGNOSIS — G5602 Carpal tunnel syndrome, left upper limb: Secondary | ICD-10-CM | POA: Insufficient documentation

## 2021-03-18 DIAGNOSIS — M25512 Pain in left shoulder: Secondary | ICD-10-CM

## 2021-03-18 DIAGNOSIS — M7552 Bursitis of left shoulder: Secondary | ICD-10-CM | POA: Diagnosis not present

## 2021-03-18 NOTE — Patient Instructions (Addendum)
Good to see you today.  You had a L shoulder injection.  Call or go to the ER if you develop a large red swollen joint with extreme pain or oozing puss.   I've referred you to Physical Therapy in [place].  Please let us know if you don't hear from them within one week regarding scheduling.  Follow-up: 6 weeks

## 2021-03-22 NOTE — Progress Notes (Signed)
Left shoulder x-ray looks okay to radiology.

## 2021-03-23 ENCOUNTER — Encounter: Payer: Self-pay | Admitting: Family Medicine

## 2021-03-24 MED ORDER — GABAPENTIN 300 MG PO CAPS: 300.0000 mg | ORAL_CAPSULE | Freq: Three times a day (TID) | ORAL | 3 refills | Status: DC | PRN

## 2021-03-24 MED ORDER — TIZANIDINE HCL 4 MG PO TABS: 4.0000 mg | ORAL_TABLET | Freq: Four times a day (QID) | ORAL | 1 refills | Status: DC | PRN

## 2021-04-09 ENCOUNTER — Other Ambulatory Visit: Payer: Self-pay | Admitting: Medical-Surgical

## 2021-04-09 DIAGNOSIS — F411 Generalized anxiety disorder: Secondary | ICD-10-CM

## 2021-04-12 NOTE — Telephone Encounter (Signed)
Last mood follow-up was 03/05/21

## 2021-04-20 ENCOUNTER — Encounter: Payer: Self-pay | Admitting: Medical-Surgical

## 2021-04-20 DIAGNOSIS — M79662 Pain in left lower leg: Secondary | ICD-10-CM | POA: Diagnosis not present

## 2021-04-20 DIAGNOSIS — S20212A Contusion of left front wall of thorax, initial encounter: Secondary | ICD-10-CM | POA: Diagnosis not present

## 2021-04-20 DIAGNOSIS — Y92410 Unspecified street and highway as the place of occurrence of the external cause: Secondary | ICD-10-CM | POA: Diagnosis not present

## 2021-04-20 DIAGNOSIS — R079 Chest pain, unspecified: Secondary | ICD-10-CM | POA: Diagnosis not present

## 2021-04-20 DIAGNOSIS — Y999 Unspecified external cause status: Secondary | ICD-10-CM | POA: Diagnosis not present

## 2021-04-20 DIAGNOSIS — M25562 Pain in left knee: Secondary | ICD-10-CM | POA: Diagnosis not present

## 2021-04-20 DIAGNOSIS — S8012XA Contusion of left lower leg, initial encounter: Secondary | ICD-10-CM | POA: Diagnosis not present

## 2021-04-21 ENCOUNTER — Ambulatory Visit: Payer: BC Managed Care – PPO | Admitting: Medical-Surgical

## 2021-04-21 DIAGNOSIS — R079 Chest pain, unspecified: Secondary | ICD-10-CM | POA: Diagnosis not present

## 2021-04-21 DIAGNOSIS — M25562 Pain in left knee: Secondary | ICD-10-CM | POA: Diagnosis not present

## 2021-04-21 DIAGNOSIS — M79662 Pain in left lower leg: Secondary | ICD-10-CM | POA: Diagnosis not present

## 2021-04-23 ENCOUNTER — Telehealth: Payer: Self-pay

## 2021-04-23 NOTE — Telephone Encounter (Signed)
Transition Care Management Unsuccessful Follow-up Telephone Call  Date of discharge and from where:  04/21/2021 from [facility]  Attempts:  1st Attempt  Reason for unsuccessful TCM follow-up call:  Left voice message

## 2021-04-24 ENCOUNTER — Encounter: Payer: Self-pay | Admitting: Medical-Surgical

## 2021-04-26 NOTE — Telephone Encounter (Signed)
Patient has not been seen for this issues. We at least need to do a virtual visit to figure out the particulars that need to be included on the form.

## 2021-04-26 NOTE — Telephone Encounter (Signed)
Transition Care Management Follow-up Telephone Call Date of discharge and from where: 04/21/2021 from [facility] How have you been since you were released from the hospital? Pt stated that is feeling okay considering. Pt did not have any questions or concerns at this time.  Any questions or concerns? No  Items Reviewed: Did the pt receive and understand the discharge instructions provided? Yes  Medications obtained and verified? Yes  Other? No  Any new allergies since your discharge? No  Dietary orders reviewed? No Do you have support at home? Yes   Functional Questionnaire: (I = Independent and D = Dependent) ADLs: I Bathing/Dressing- I Meal Prep- I Eating- I Maintaining continence- I Transferring/Ambulation- I Managing Meds- I   Follow up appointments reviewed: PCP Hospital f/u appt confirmed? No   Specialist Hospital f/u appt confirmed? No   Are transportation arrangements needed? No  If their condition worsens, is the pt aware to call PCP or go to the Emergency Dept.? Yes Was the patient provided with contact information for the PCP's office or ED? Yes Was to pt encouraged to call back with questions or concerns? Yes

## 2021-04-28 ENCOUNTER — Telehealth (INDEPENDENT_AMBULATORY_CARE_PROVIDER_SITE_OTHER): Payer: BC Managed Care – PPO | Admitting: Medical-Surgical

## 2021-04-28 ENCOUNTER — Telehealth: Payer: Self-pay | Admitting: Medical-Surgical

## 2021-04-28 ENCOUNTER — Encounter: Payer: Self-pay | Admitting: Medical-Surgical

## 2021-04-28 DIAGNOSIS — Z8616 Personal history of COVID-19: Secondary | ICD-10-CM | POA: Diagnosis not present

## 2021-04-28 DIAGNOSIS — Z0289 Encounter for other administrative examinations: Secondary | ICD-10-CM

## 2021-04-28 NOTE — Progress Notes (Signed)
Virtual Visit via Video Note  I connected with Henry Marshall on 04/28/21 at 10:30 AM EST by a video enabled telemedicine application and verified that I am speaking with the correct person using two identifiers.   I discussed the limitations of evaluation and management by telemedicine and the availability of in person appointments. The patient expressed understanding and agreed to proceed.  Patient location: home Provider locations: office  Subjective:    CC: discuss FMLA forms for COVID  HPI: Pleasant 40 year old male presenting today via MyChart video visit to discuss COVID positive test and the requirement for FMLA forms.  He tested positive on 11/30 for COVID.  His symptoms started on the same day with rhinorrhea, cough, sneezing, ear pressure/pain, and fever.  His symptoms have continued to improve and he is doing much better.  He does still have a productive cough but otherwise feels much better.  No fever higher than 99 in the past 24 hours.  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  These have been sent via MyChart for our completion.  Past medical history, Surgical history, Family history not pertinant except as noted below, Social history, Allergies, and medications have been entered into the medical record, reviewed, and corrections made.   Review of Systems: See HPI for pertinent positives and negatives.   Objective:    General: Speaking clearly in complete sentences without any shortness of breath.  Alert and oriented x3.  Normal judgment. No apparent acute distress.  Impression and Recommendations:    1. Encounter for completion of form with patient 2. Personal history of COVID-19 Looks like he is on the mend from COVID and symptoms are resolving.  He has been fever free for greater than 24 hours so he is cleared to return to work in a couple of days.  FMLA forms printed out and completed with  start date of 11/30 and return date of 12/9.  Patient advised that he will be notified when papers have been completed and he will be able to pick up the originals from our front office.  Patient verbalized understanding.  I discussed the assessment and treatment plan with the patient. The patient was provided an opportunity to ask questions and all were answered. The patient agreed with the plan and demonstrated an understanding of the instructions.   The patient was advised to call back or seek an in-person evaluation if the symptoms worsen or if the condition fails to improve as anticipated.  25 minutes of non-face-to-face time was provided during this encounter.  Return if symptoms worsen or fail to improve.  Thayer Ohm, DNP, APRN, FNP-BC [facility] and Sports Medicine

## 2021-04-28 NOTE — Progress Notes (Signed)
Covid positive Thursday night.

## 2021-04-28 NOTE — Telephone Encounter (Signed)
FMLA paperwork has been completed for the provider portion.  Patient needs to complete his part to make sure he sends all pages.  After that, they can be faxed.  Please scan a copy into his chart under media for future reference.  Completed forms have been placed in Tomekia's box.   ___________________________________________ Thayer Ohm, DNP, APRN, FNP-BC Primary Care and Sports Medicine [facility]

## 2021-04-29 ENCOUNTER — Telehealth: Payer: Self-pay

## 2021-04-29 ENCOUNTER — Ambulatory Visit: Payer: BC Managed Care – PPO | Admitting: Family Medicine

## 2021-04-29 NOTE — Telephone Encounter (Signed)
Pt made aware that forms are ready for pickup at the front desk. Pt also aware there are portions for him to fill out.

## 2021-04-29 NOTE — Telephone Encounter (Signed)
Called pt and let him know his FMLA papers are at the front desk and ready for pickup. Informed pt there are areas on the form he needs to fill out. Pt expressed understanding.

## 2021-04-30 ENCOUNTER — Encounter: Payer: Self-pay | Admitting: Medical-Surgical

## 2021-04-30 NOTE — Telephone Encounter (Signed)
Pt came to pick up paperwork.  Christen Butter changed the date on the form.  No further action needed.  Tiajuana Amass, CMA

## 2021-05-11 ENCOUNTER — Encounter: Payer: Self-pay | Admitting: Medical-Surgical

## 2021-06-03 ENCOUNTER — Other Ambulatory Visit: Payer: Self-pay | Admitting: Medical-Surgical

## 2021-06-03 ENCOUNTER — Other Ambulatory Visit: Payer: Self-pay | Admitting: Family Medicine

## 2021-06-03 DIAGNOSIS — F902 Attention-deficit hyperactivity disorder, combined type: Secondary | ICD-10-CM

## 2021-06-03 DIAGNOSIS — F411 Generalized anxiety disorder: Secondary | ICD-10-CM

## 2021-06-03 MED ORDER — LISDEXAMFETAMINE DIMESYLATE 50 MG PO CAPS: 50.0000 mg | ORAL_CAPSULE | Freq: Every day | ORAL | 0 refills | Status: DC

## 2021-06-03 NOTE — Telephone Encounter (Signed)
Last refill sent 04/13/21

## 2021-06-03 NOTE — Telephone Encounter (Signed)
Patient due for ADHD follow up. Please have him schedule an appointment in order to maintain his Vyvanse prescription. 30 day supply sent in today.

## 2021-07-20 ENCOUNTER — Other Ambulatory Visit: Payer: Self-pay | Admitting: Family Medicine

## 2021-07-20 MED ORDER — TIZANIDINE HCL 4 MG PO TABS: 4.0000 mg | ORAL_TABLET | Freq: Three times a day (TID) | ORAL | 1 refills | Status: DC | PRN

## 2021-08-29 IMAGING — US US CAROTID DUPLEX BILAT
1 series · 13 of 24 positions shown · non-contrast
Comparison: None.

CLINICAL DATA: Left anterior neck pain for the past several months.

EXAM:
BILATERAL CAROTID DUPLEX ULTRASOUND
TECHNIQUE: Gray scale imaging, color Doppler and duplex ultrasound were
performed of bilateral carotid and vertebral arteries in the neck.

[Series 1: us carotid duplex bilat · 0.05mm/px · 13 of 67 slices shown]
[im 1/67]
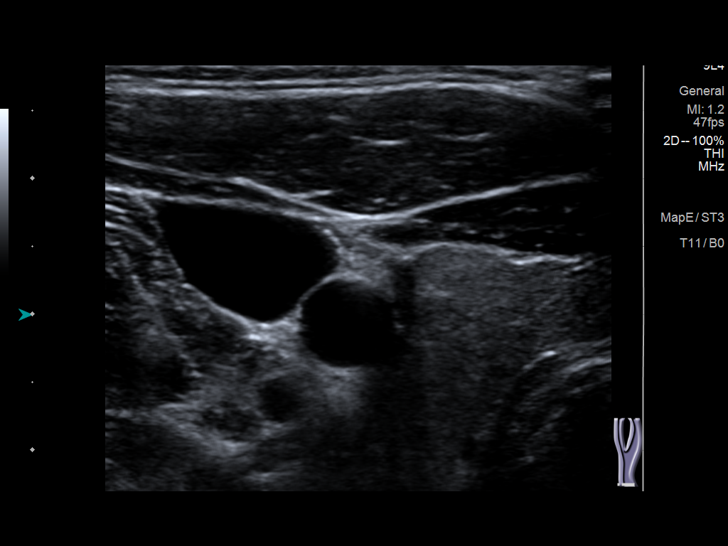
[im 6/67]
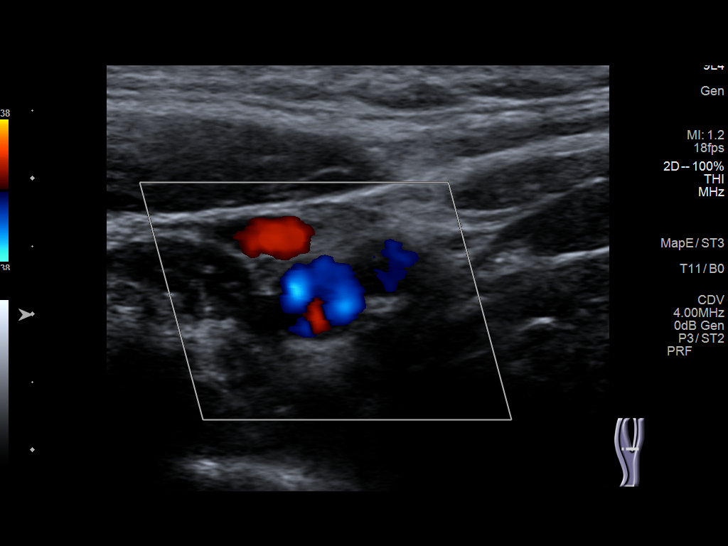
[im 12/67]
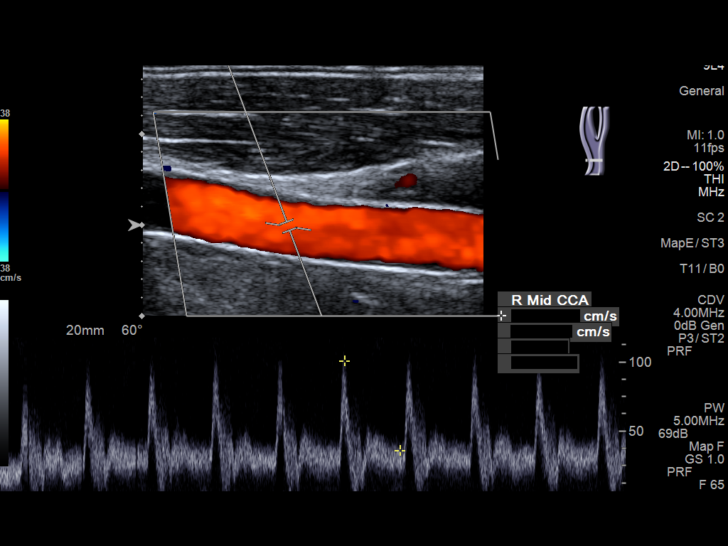
[im 18/67]
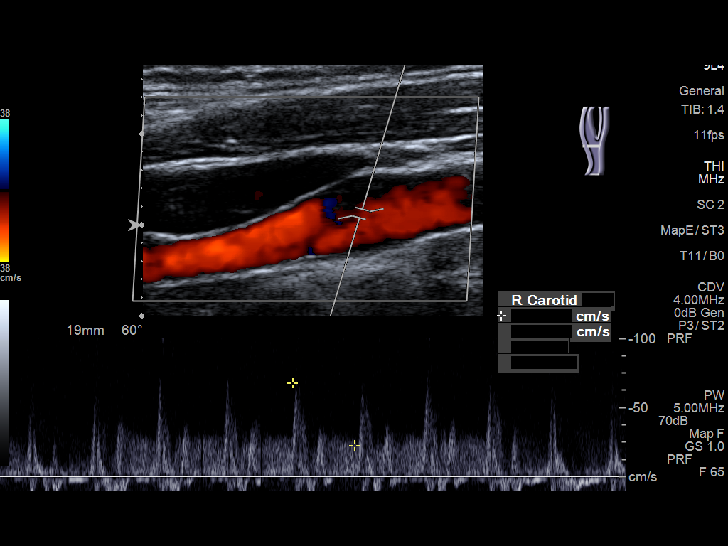
[im 23/67]
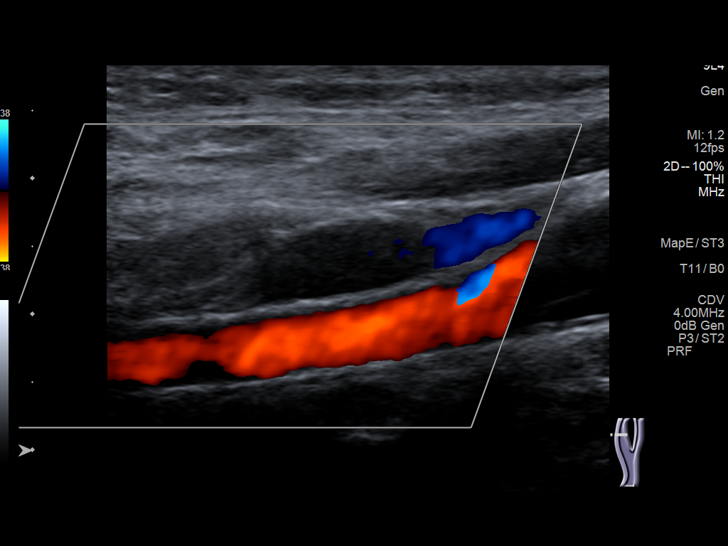
[im 29/67]
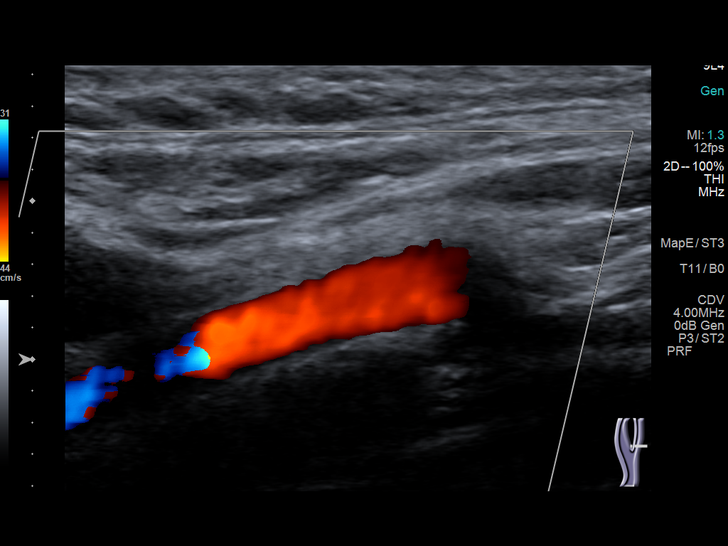
[im 35/67]
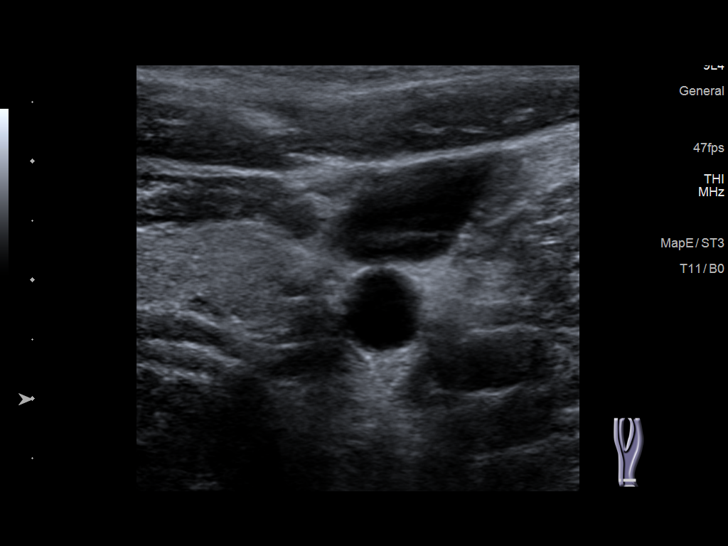
[im 38/67]
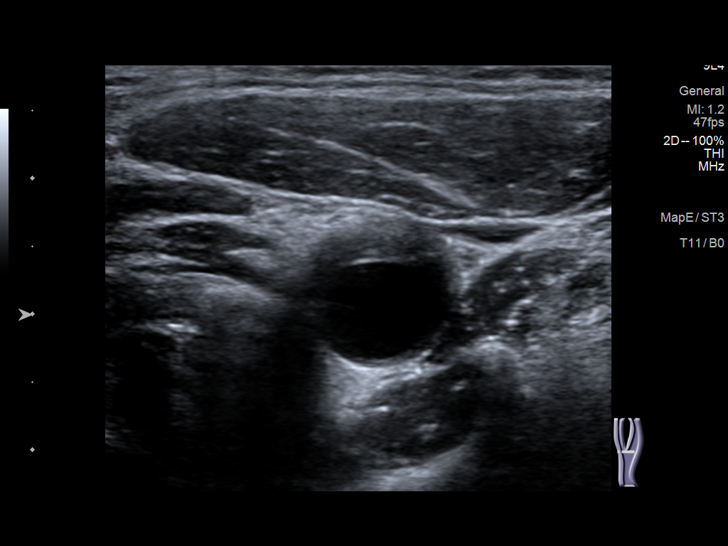
[im 44/67]
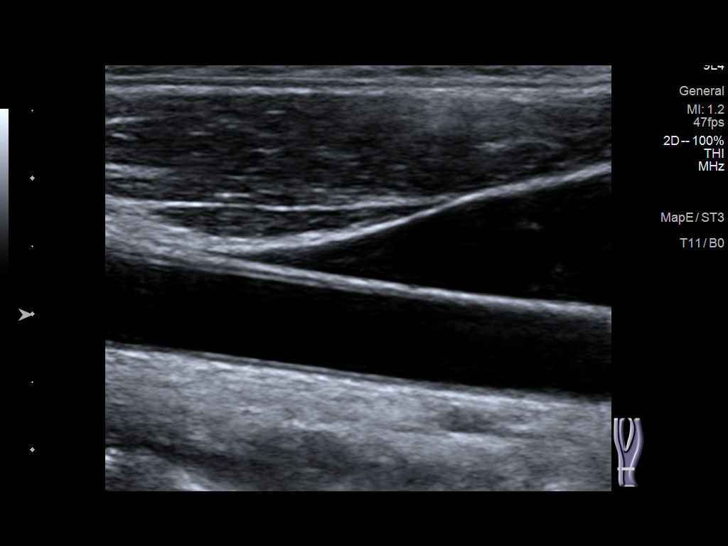
[im 49/67]
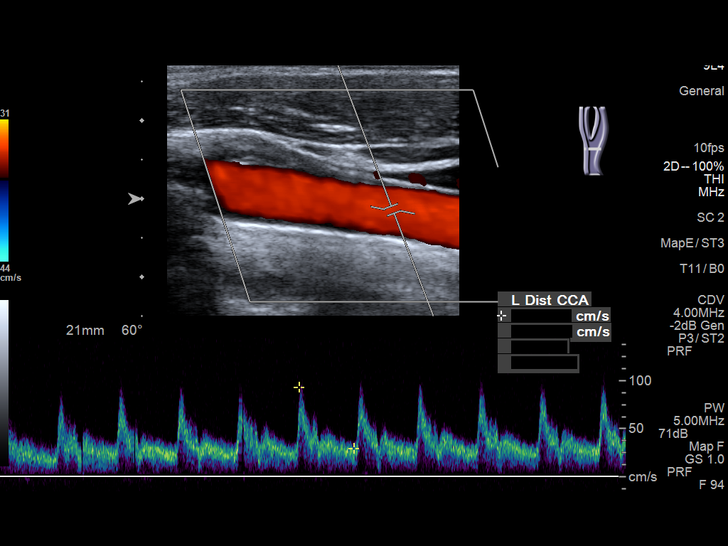
[im 55/67]
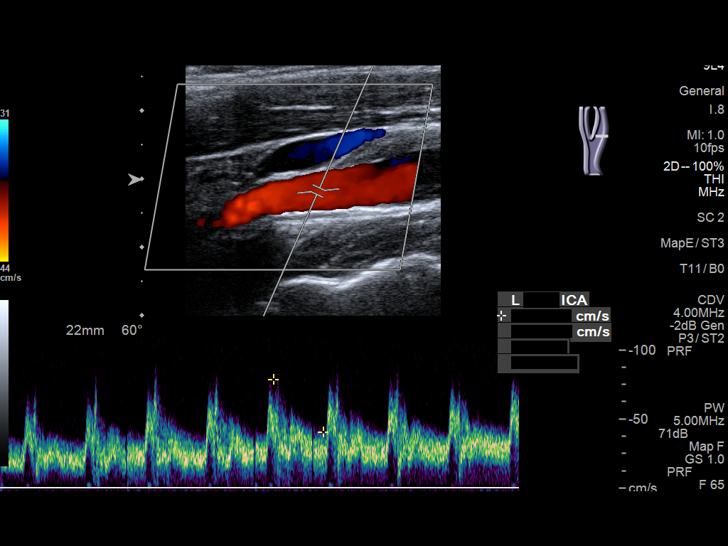
[im 61/67]
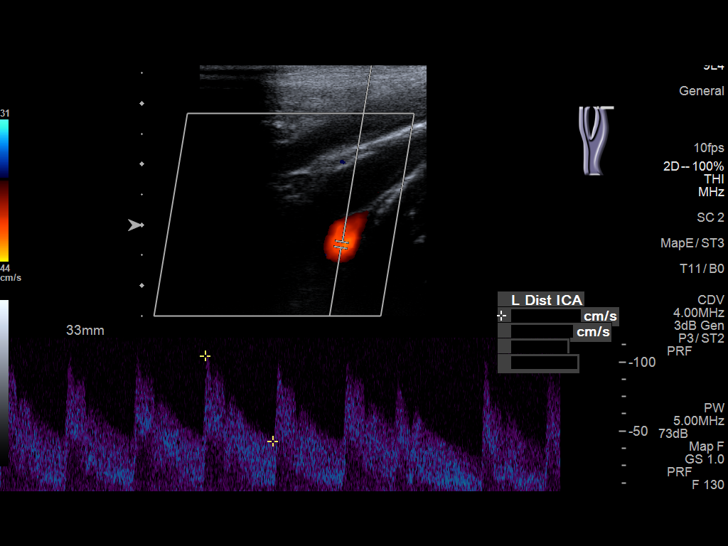
[im 67/67]
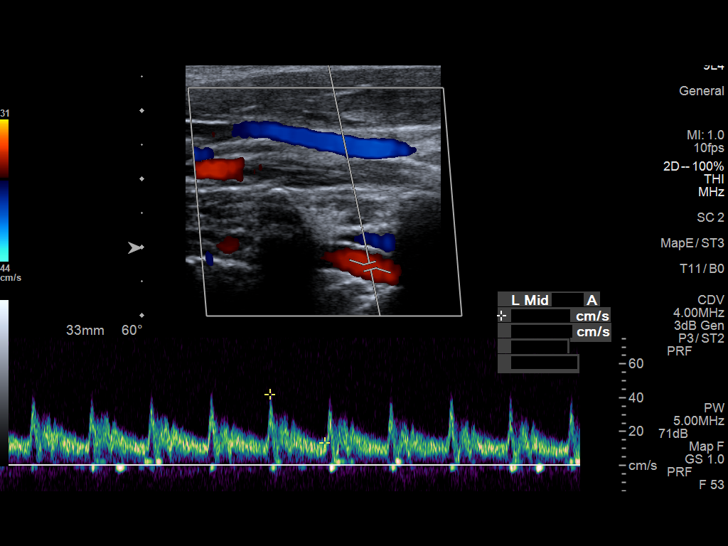

[13 of 24 positions shown; findings below may reference images not displayed]

FINDINGS: Criteria: Quantification of carotid stenosis is based on velocity
parameters that correlate the residual internal carotid diameter
with NASCET-based stenosis levels, using the diameter of the distal
internal carotid lumen as the denominator for stenosis measurement.

The following velocity measurements were obtained:

RIGHT

ICA: 91/45 cm/sec

CCA: 101/36 cm/sec

SYSTOLIC ICA/CCA RATIO:

ECA: 136 cm/sec

LEFT

ICA: 104/43 cm/sec

CCA: 103/34 cm/sec

SYSTOLIC ICA/CCA RATIO:

ECA: 109 cm/sec

RIGHT CAROTID ARTERY: Normal sonographic evaluation of the right
carotid system. There are no elevated peak systolic velocities
within the interrogated course of the right internal carotid artery
to suggest a hemodynamically significant stenosis.

RIGHT VERTEBRAL ARTERY:  Antegrade Flow

LEFT CAROTID ARTERY: There is a minimal to moderate amount eccentric
hypoechoic plaque/intimal wall thickening within the left carotid
bulb (image 36 and 48 and 51), not resulting in elevated peak
systolic velocities within the interrogated course the left internal
carotid artery to suggest a hemodynamically significant stenosis. No
definitive intraluminal vessel wall irregularity or perivascular
stranding/fluid.

LEFT VERTEBRAL ARTERY:  Antegrade flow

Upper extremity blood pressures: RIGHT: 124/86 LEFT: 130/85
IMPRESSION: While sonographic evaluation of right carotid system is normal,
there is a minimal to moderate amount of asymmetric eccentric
hypoechoic plaque/intimal wall thickening within the left carotid
bulb, an atypical finding, and given patient's young age and history
of left-sided neck pain is nonspecific though could be seen in the
setting of a vasculitis and/or the sequela of prior non flow
limiting dissection. Clinical correlation is advised. Further
evaluation with CTA could performed as indicated.

These results will be called to the ordering clinician or
representative by the Radiologist Assistant, and communication
documented in the PACS or [REDACTED].

## 2021-08-29 IMAGING — US US SOFT TISSUE HEAD/NECK
1 series · 4 of 4 positions shown · non-contrast
Comparison: None.

CLINICAL DATA: Left-sided neck pain/pressure with swallowing.

EXAM:
ULTRASOUND OF HEAD/NECK SOFT TISSUES
TECHNIQUE: Ultrasound examination of the head and neck soft tissues was
performed in the area of clinical concern.

[Series 1: us soft tissue head/neck · 4 acquisitions, 4 frames shown]
[im 1/4]
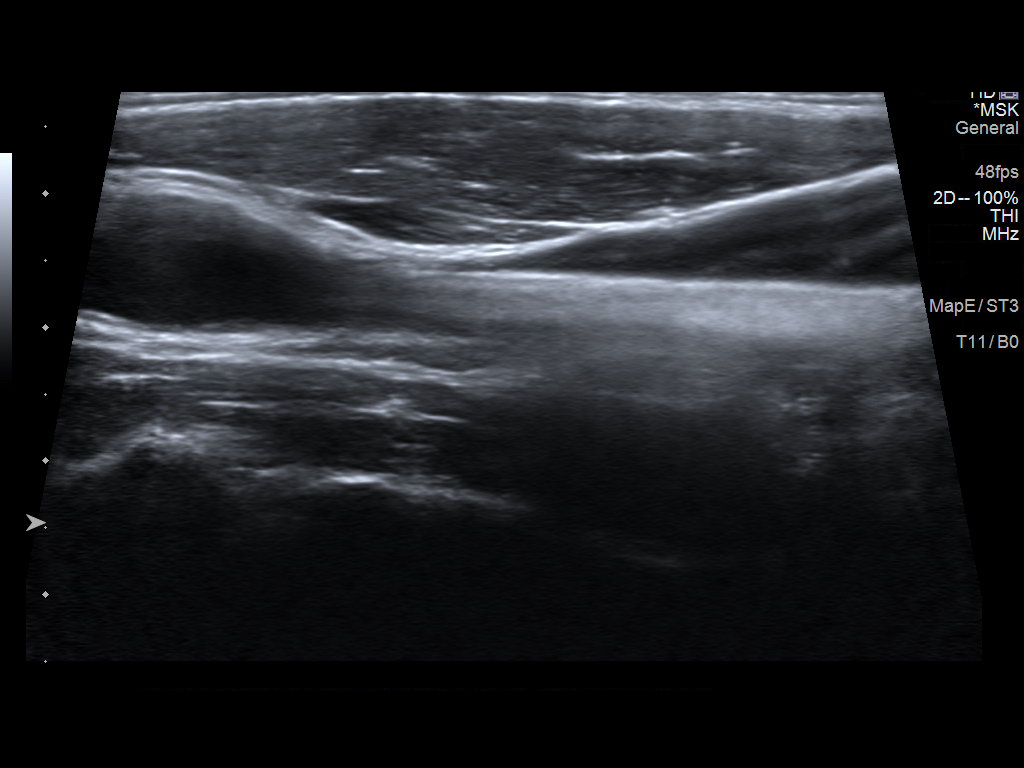
[im 2/4]
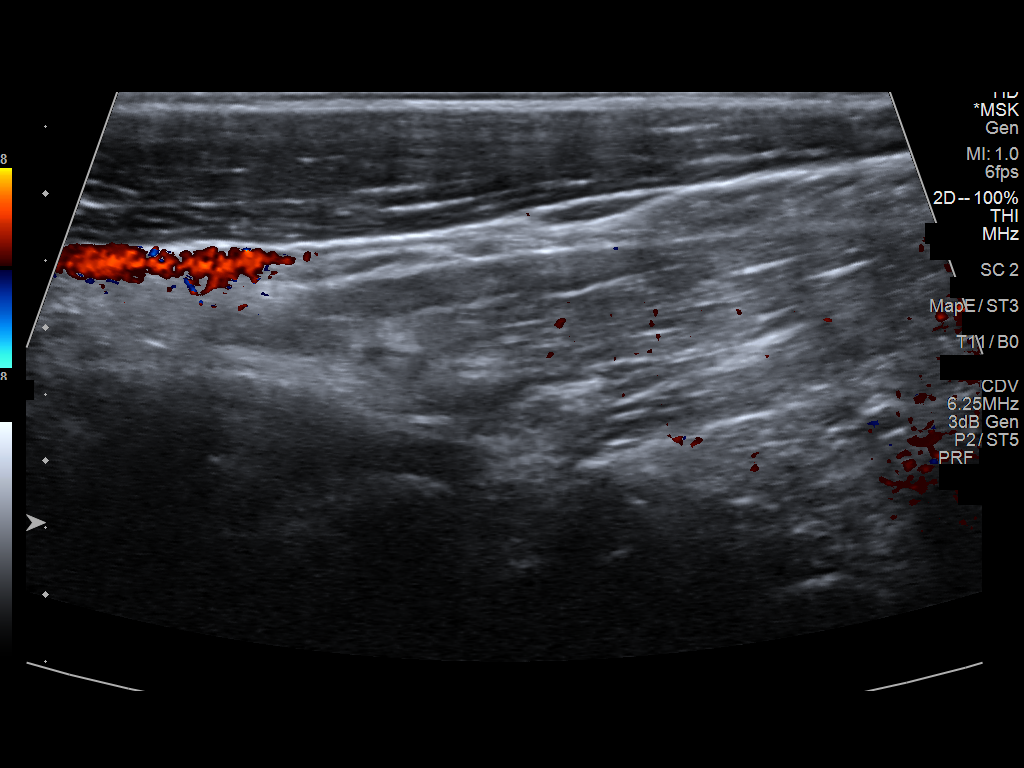
[im 3/4]
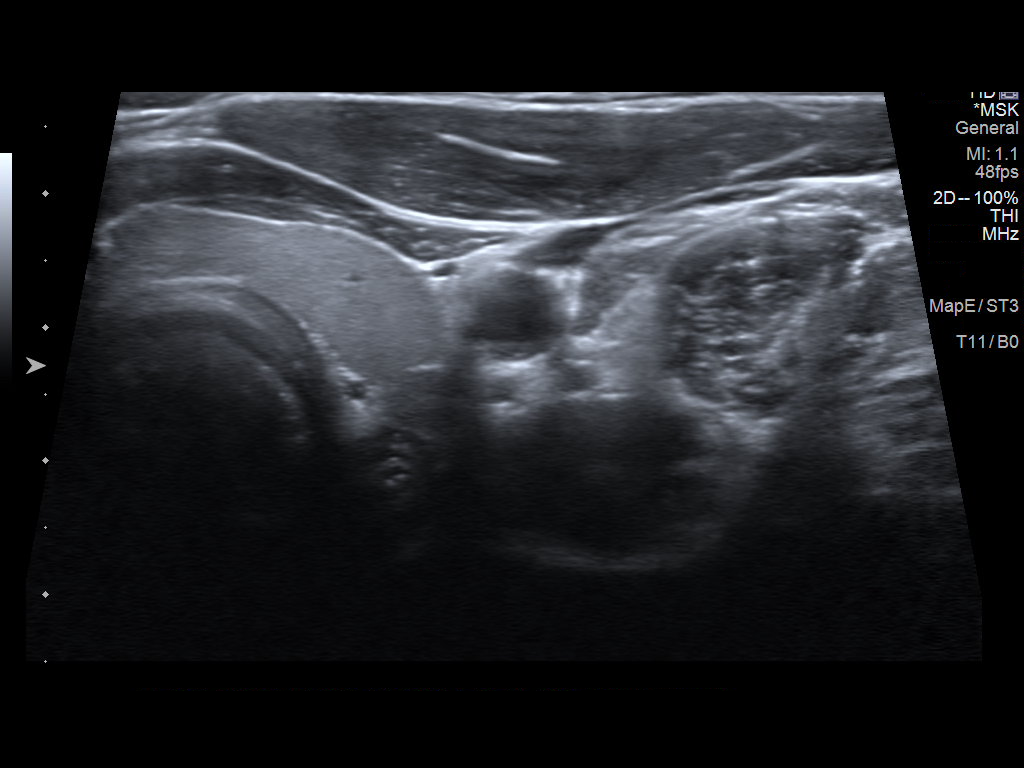
[im 4/4]
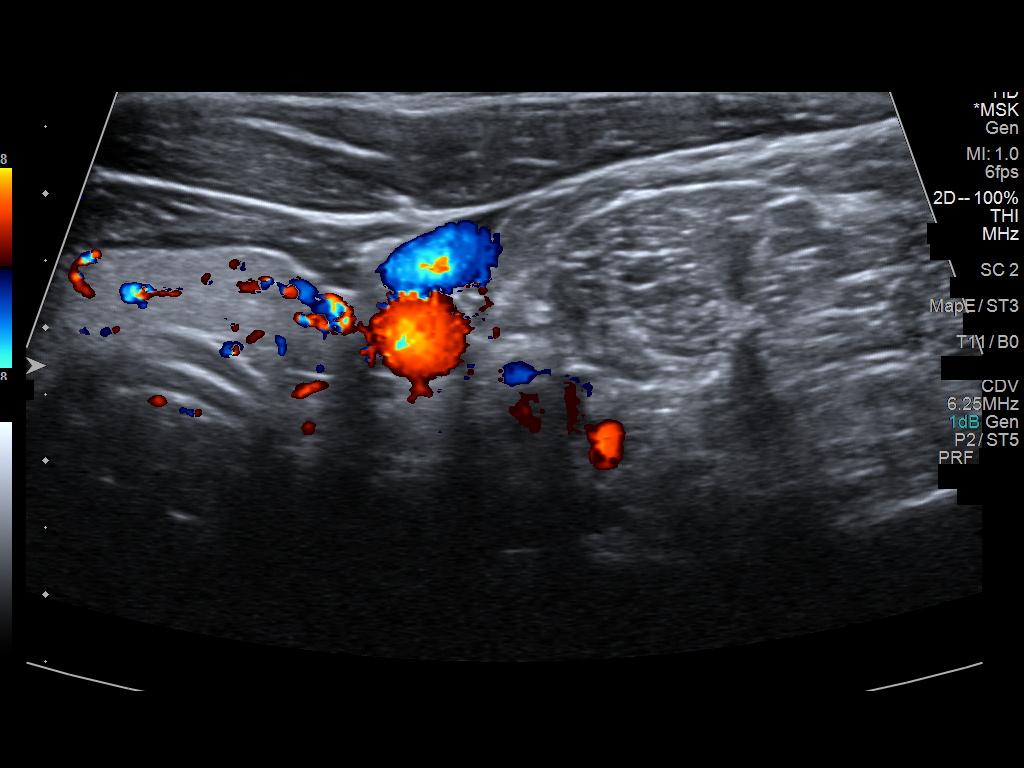

[4 of 4 positions shown; findings below may reference images not displayed]

FINDINGS: Sonographic evaluation of the patient's area of concern involving
the left anterior neck is negative for sonographic correlate.
Specifically, no discrete solid or cystic lesions. No regional
cervical lymphadenopathy.

Limited visualization of the adjacent left lobe of the thyroid is
normal.
IMPRESSION: No sonographic correlate for patient's area of pain/pressure
involving the left side of the neck. Specifically, no discrete solid
or cystic mass. No regional cervical lymphadenopathy.

## 2021-08-30 ENCOUNTER — Other Ambulatory Visit: Payer: Self-pay | Admitting: Medical-Surgical

## 2021-08-30 NOTE — Telephone Encounter (Signed)
Pls call pt to schedule follow-up appt for ADHD and GAD. ?Sending 30 day refill. Thanks ? ?

## 2021-08-30 NOTE — Telephone Encounter (Signed)
Patient has been scheduled for 10/13/21. AM ?

## 2021-09-01 ENCOUNTER — Encounter: Payer: Self-pay | Admitting: Family Medicine

## 2021-09-06 ENCOUNTER — Encounter: Payer: Self-pay | Admitting: Family Medicine

## 2021-09-06 ENCOUNTER — Ambulatory Visit (INDEPENDENT_AMBULATORY_CARE_PROVIDER_SITE_OTHER): Payer: BC Managed Care – PPO | Admitting: Family Medicine

## 2021-09-06 ENCOUNTER — Ambulatory Visit (INDEPENDENT_AMBULATORY_CARE_PROVIDER_SITE_OTHER): Payer: BC Managed Care – PPO

## 2021-09-06 ENCOUNTER — Ambulatory Visit: Payer: Self-pay

## 2021-09-06 VITALS — BP 100/78 | HR 89 | Ht 65.0 in | Wt 221.2 lb

## 2021-09-06 DIAGNOSIS — M79671 Pain in right foot: Secondary | ICD-10-CM

## 2021-09-06 DIAGNOSIS — M25511 Pain in right shoulder: Secondary | ICD-10-CM | POA: Diagnosis not present

## 2021-09-06 DIAGNOSIS — M25512 Pain in left shoulder: Secondary | ICD-10-CM

## 2021-09-06 DIAGNOSIS — G8929 Other chronic pain: Secondary | ICD-10-CM | POA: Diagnosis not present

## 2021-09-06 NOTE — Patient Instructions (Addendum)
Good to see you today. ? ?You had B shoulder (AC joint) injections.  Call or go to the ER if you develop a large red swollen joint with extreme pain or oozing puss.  ? ? ?Please perform the exercise program that we have prepared for you and gone over in detail on a daily basis.  In addition to the handout you were provided you can access your program through: [email or web address]  ? ?Your unique program code is:   [id number] ? ?You can use a night splint for your foot/heel. ? ?Ice cup massage for your heel/foot. ? ?Please get an Xray today before you leave. ? ?Follow-up: 6 weeks ?

## 2021-09-06 NOTE — Progress Notes (Signed)
? ?I, [name], LAT, ATC, am serving as scribe for Dr. [name]. ? ?Henry Marshall is a 41 y.o. male who presents to [facility] at [facility] today for R foot pain.  He was last seen by Dr. [name] on 03/18/21 for L shoulder pain.  Today, pt reports R foot pain x approximately one month.  He locates his pain to his R plantar heel and lateral plantar foot. ? ?R foot swelling: no ?Aggravating factors: immediate weight-bearing after periods of rest; walking ?Treatments tried: changed his shoes; Tylenol; Tizanidine ? ?B shoulders: Ant shoulder pain.  Locates pain to his ACJ bilaterally.  He had a prior L subacromial steroid injection on 03/18/21 w/ immediate relief but not long-lasting relief.  He's changed pillows.  He has increased pain when laying on his R or L side. ? ?Diagnostic testing: L shoulder XR- 03/18/21 ? ?Pertinent review of systems: No fevers or chills ? 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 ? ? ?Exam:  ?BP 100/78 (BP Location: Right Arm, Patient Position: Sitting, Cuff Size: Normal)   Pulse 89   Ht 5\' 5"  (1.651 m)   Wt 221 lb 3.2 oz (100.3 kg)   SpO2 95%   BMI 36.81 kg/m?  ?General: Well Developed, well nourished, and in no acute distress.  ? ?MSK: Right shoulder normal-appearing ?Tender palpation at [facility] joint. ?Normal shoulder range of motion. ?Pain with crossover arm compression test. ?Strength is intact. ? ?Left shoulder normal-appearing ?Tender palpation at [facility] joint. ?Normal shoulder range of motion. ?Pain with crossover arm compression test. ?Strength is intact. ? ?Right foot and ankle normal-appearing ?Tender palpation plantar lateral calcaneus. ?Normal foot and ankle motion. ?Intact strength. ? ?Lab and Radiology Results ? ? ? ?Procedure: Real-time Ultrasound Guided Injection of right shoulder AC joint ?Device: Philips Affiniti 50G ?Images permanently stored and available for review in PACS ?Verbal  informed consent obtained.  Discussed risks and benefits of procedure. Warned about infection, bleeding, hyperglycemia damage to structures among others. ?Patient expresses understanding and agreement ?Time-out conducted.   ?Noted no overlying erythema, induration, or other signs of local infection.   ?Skin prepped in a sterile fashion.   ?Local anesthesia: Topical Ethyl chloride.   ?With sterile technique and under real time ultrasound guidance: 40 mg of Kenalog and 1 mL of Marcaine injected into AC joint. Fluid seen entering the capsule.   ?Completed without difficulty   ?Pain immediately resolved suggesting accurate placement of the medication.   ?Advised to call if fevers/chills, erythema, induration, drainage, or persistent bleeding.   ?Images permanently stored and available for review in the ultrasound unit.  ?Impression: Technically successful ultrasound guided injection. ? ? ? ?Procedure: Real-time Ultrasound Guided Injection of left shoulder AC joint ?Device: Philips Affiniti 50G ?Images permanently stored and available for review in PACS ?Verbal informed consent obtained.  Discussed risks and benefits of procedure. Warned about infection, bleeding, hyperglycemia damage to structures among others. ?Patient expresses understanding and agreement ?Time-out conducted.   ?Noted no overlying erythema, induration, or other signs of local infection.   ?Skin prepped in a sterile fashion.   ?Local anesthesia: Topical Ethyl chloride.   ?With sterile technique and under real time ultrasound guidance: 40 mg of Kenalog and 1 mL of Marcaine injected into AC joint. Fluid seen entering the joint capsule.   ?Completed without difficulty   ?Pain moderately  resolved suggesting accurate placement of the medication.   ?Advised to call if fevers/chills, erythema,  induration, drainage, or persistent bleeding.   ?Images permanently stored and available for review in the ultrasound unit.  ?Impression: Technically successful  ultrasound guided injection. ? ? ? ?X-ray images right shoulder and right calcaneus obtained today personally and independently interpreted ? ?Right shoulder: No humeral AVN present per my read. ?No severe glenohumeral DJD.  AC DJD mild is present. ? ?Right calcaneus: No acute fractures no severe DJD. ? ?Await formal radiology review ? ?EXAM: ?LEFT SHOULDER - 2+ VIEW ?  ?COMPARISON:  None. ?  ?FINDINGS: ?There is no evidence of fracture or dislocation. There is no ?evidence of arthropathy or other focal bone abnormality. Soft ?tissues are unremarkable. ?  ?IMPRESSION: ?No fracture or dislocation of the left shoulder. Joint spaces are ?preserved. ?  ?  ?Electronically Signed ?  By: Jearld Lesch M.D. ?  On: 03/20/2021 09:41 ?I, [name], personally (independently) visualized and performed the interpretation of the images attached in this note. ? ?Assessment and Plan: ?41 y.o. male with bilateral shoulder pain.  Pain is located primarily at the [facility] joint area.  Plan for [facility] joint injection today.  He had good immediate results from the injection indicating this is the primary pain generator.  Hopefully will have good long-term benefit from the injection as well. ? ?Additionally his right plantar lateral foot pain.  This is mostly consistent with plantar fasciitis.  Plan for eccentric exercises ice heel cups and night splints. ? ?Recheck in 8 weeks. ? ? ?PDMP not reviewed this encounter. ?Orders Placed This Encounter  ?Procedures  ? [place] LIMITED JOINT SPACE STRUCTURES LOW RIGHT(NO LINKED CHARGES)  ?  Order Specific Question:   Reason for Exam (SYMPTOM  OR DIAGNOSIS REQUIRED)  ?  Answer:   R foot pain  ?  Order Specific Question:   Preferred imaging location?  ?  Answer:   [facility]ult [facility]  ? DG Shoulder Right  ?  Standing Status:   Future  ?  Number of Occurrences:   1  ?  Standing Expiration Date:   10/06/2021  ?  Order Specific Question:   Reason for Exam (SYMPTOM  OR DIAGNOSIS REQUIRED)  ?  Answer:    R shoulder pain  ?  Order Specific Question:   Preferred imaging location?  ?  Answer:   [facility]  ? DG [place] Right  ?  Standing Status:   Future  ?  Number of Occurrences:   1  ?  Standing Expiration Date:   09/07/2022  ?  Order Specific Question:   Reason for Exam (SYMPTOM  OR DIAGNOSIS REQUIRED)  ?  Answer:   eval heel pain  ?  Order Specific Question:   Preferred imaging location?  ?  Answer:   [facility]  ? ?No orders of the defined types were placed in this encounter. ? ? ? ?Discussed warning signs or symptoms. Please see discharge instructions. Patient expresses understanding. ? ? ?The above documentation has been reviewed and is accurate and complete [name], M.D. ? ? ?

## 2021-09-07 MED ORDER — HYDROCODONE-ACETAMINOPHEN 5-325 MG PO TABS: 1.0000 | ORAL_TABLET | Freq: Four times a day (QID) | ORAL | 0 refills | Status: DC | PRN

## 2021-09-07 NOTE — Progress Notes (Signed)
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 This is the small joint at the top of the shoulder. No AVN. No fracture seen.

## 2021-09-07 NOTE — Progress Notes (Signed)
Right heel xray shows a small heel spur. No fracture seen. No AVN.

## 2021-10-10 ENCOUNTER — Encounter: Payer: Self-pay | Admitting: Family Medicine

## 2021-10-13 ENCOUNTER — Encounter: Payer: Self-pay | Admitting: Medical-Surgical

## 2021-10-13 ENCOUNTER — Ambulatory Visit: Payer: BC Managed Care – PPO | Admitting: Medical-Surgical

## 2021-10-13 VITALS — BP 141/86 | HR 75 | Resp 20 | Ht 65.0 in | Wt 220.2 lb

## 2021-10-13 DIAGNOSIS — F411 Generalized anxiety disorder: Secondary | ICD-10-CM | POA: Diagnosis not present

## 2021-10-13 DIAGNOSIS — Z0000 Encounter for general adult medical examination without abnormal findings: Secondary | ICD-10-CM

## 2021-10-13 DIAGNOSIS — F902 Attention-deficit hyperactivity disorder, combined type: Secondary | ICD-10-CM

## 2021-10-13 MED ORDER — ALPRAZOLAM 0.5 MG PO TABS: ORAL_TABLET | ORAL | 0 refills | Status: DC

## 2021-10-13 MED ORDER — ESCITALOPRAM OXALATE 10 MG PO TABS: 10.0000 mg | ORAL_TABLET | Freq: Every day | ORAL | 1 refills | Status: DC

## 2021-10-13 MED ORDER — LISDEXAMFETAMINE DIMESYLATE 50 MG PO CAPS: 50.0000 mg | ORAL_CAPSULE | Freq: Every day | ORAL | 0 refills | Status: DC

## 2021-10-13 NOTE — Assessment & Plan Note (Signed)
Symptoms better than before however not quite at goal.  Increasing Lexapro to 10 mg daily.  Discussed use of Xanax for severe anxiety or panic attacks.  I will send in another 15 tablets however I would like him to try make this last about 90 days or more.  Patient verbalized understanding is agreeable to the plan.

## 2021-10-13 NOTE — Assessment & Plan Note (Signed)
>>  ASSESSMENT AND PLAN FOR ATTENTION DEFICIT HYPERACTIVITY DISORDER (ADHD), COMBINED TYPE WRITTEN ON 10/13/2021  2:08 PM BY Peighton Edgin, NP  Resume Vyvanse  50 mg daily.  Recent blood pressure a month ago was normal so suspect today's elevation is likely related to stress/anxiety and rushing to get to his appointment.

## 2021-10-13 NOTE — Progress Notes (Signed)
Established Patient Office Visit  Subjective   Patient ID: Henry Marshall, male    DOB: 01-21-1981  Age: 41 y.o. MRN: [id number]  Chief Complaint  Patient presents with   Follow-up   Anxiety    HPI Pleasant 41 year old male presenting today for the following:  ADHD: previously taking Vyvanse 50mg  daily, tolerated well without side effects. Has been out of the medication for several months. Having difficulty functioning with the inattention issues.   GAD: lately has been more irritable and tends to worry about a lot of things. Hard to wind down at night to get to sleep. Listens to sleep stories on Calm and is using a mask. Taking Lexapro 5mg  daily, tolerating well without side effects. No reduction in libido.  Has been out of Xanax for quite a while and was able to manage although he did have some tough spots that he felt that Xanax would have been helpful in.  Review of Systems  Constitutional:  Negative for chills, fever and malaise/fatigue.  Respiratory:  Negative for cough, shortness of breath and wheezing.   Cardiovascular:  Negative for chest pain, palpitations and leg swelling.  Neurological:  Negative for dizziness and headaches.  Psychiatric/Behavioral:  Negative for depression and suicidal ideas. The patient is nervous/anxious and has insomnia.      Objective:     BP (!) 141/86 (BP Location: Right Arm, Cuff Size: Normal)   Pulse 75   Resp 20   Ht 5\' 5"  (1.651 m)   Wt 220 lb 3.2 oz (99.9 kg)   SpO2 96%   BMI 36.64 kg/m    Physical Exam Vitals and nursing note reviewed.  Constitutional:      General: He is not in acute distress.    Appearance: Normal appearance. He is obese. He is not ill-appearing.  HENT:     Head: Normocephalic and atraumatic.  Cardiovascular:     Rate and Rhythm: Normal rate and regular rhythm.     Pulses: Normal pulses.     Heart sounds: Normal heart sounds. No murmur heard.   No friction rub. No gallop.  Pulmonary:     Effort:  Pulmonary effort is normal. No respiratory distress.     Breath sounds: Normal breath sounds.  Skin:    General: Skin is warm and dry.  Neurological:     Mental Status: He is alert and oriented to person, place, and time.  Psychiatric:        Mood and Affect: Mood normal.        Behavior: Behavior normal.        Thought Content: Thought content normal.        Judgment: Judgment normal.   No results found for any visits on 10/13/21.    The ASCVD Risk score (Arnett DK, et al., 2019) failed to calculate for the following reasons:   Cannot find a previous HDL lab   Cannot find a previous total cholesterol lab    Assessment & Plan:   Problem List Items Addressed This Visit       Other   Attention deficit hyperactivity disorder (ADHD), combined type - Primary    Resume Vyvanse 50 mg daily.  Recent blood pressure a month ago was normal so suspect today's elevation is likely related to stress/anxiety and rushing to get to his appointment.       Relevant Medications   lisdexamfetamine (VYVANSE) 50 MG capsule   GAD (generalized anxiety disorder)    Symptoms better than before however  not quite at goal.  Increasing Lexapro to 10 mg daily.  Discussed use of Xanax for severe anxiety or panic attacks.  I will send in another 15 tablets however I would like him to try make this last about 90 days or more.  Patient verbalized understanding is agreeable to the plan.       Relevant Medications   ALPRAZolam (XANAX) 0.5 MG tablet   escitalopram (LEXAPRO) 10 MG tablet   Other Visit Diagnoses     Preventative health care       Relevant Orders   CBC with Differential/Platelet   Lipid panel   COMPLETE METABOLIC PANEL WITH GFR      Return in about 3 months (around 01/13/2022) for mood/ADHD follow up.  ___________________________________________ Thayer Ohm, DNP, APRN, FNP-BC Primary Care and Sports Medicine [facility] [place]

## 2021-10-13 NOTE — Assessment & Plan Note (Addendum)
Resume Vyvanse 50 mg daily.  Recent blood pressure a month ago was normal so suspect today's elevation is likely related to stress/anxiety and rushing to get to his appointment.

## 2021-10-14 LAB — COMPLETE METABOLIC PANEL WITH GFR
AG Ratio: 2.2 (calc) (ref 1.0–2.5)
ALT: 23 U/L (ref 9–46)
AST: 13 U/L (ref 10–40)
Albumin: 4.7 g/dL (ref 3.6–5.1)
Alkaline phosphatase (APISO): 72 U/L (ref 36–130)
BUN: 12 mg/dL (ref 7–25)
CO2: 26 mmol/L (ref 20–32)
Calcium: 9.5 mg/dL (ref 8.6–10.3)
Chloride: 105 mmol/L (ref 98–110)
Creat: 0.86 mg/dL (ref 0.60–1.29)
Globulin: 2.1 g/dL (calc) (ref 1.9–3.7)
Glucose, Bld: 108 mg/dL — ABNORMAL HIGH (ref 65–99)
Potassium: 4.9 mmol/L (ref 3.5–5.3)
Sodium: 141 mmol/L (ref 135–146)
Total Bilirubin: 0.3 mg/dL (ref 0.2–1.2)
Total Protein: 6.8 g/dL (ref 6.1–8.1)
eGFR: 110 mL/min/{1.73_m2} (ref >=60)

## 2021-10-14 LAB — LIPID PANEL
Cholesterol: 247 mg/dL — ABNORMAL HIGH (ref <200)
HDL: 45 mg/dL (ref >=40)
LDL Cholesterol (Calc): 164 mg/dL (calc) — ABNORMAL HIGH
Non-HDL Cholesterol (Calc): 202 mg/dL (calc) — ABNORMAL HIGH (ref <130)
Total CHOL/HDL Ratio: 5.5 (calc) — ABNORMAL HIGH (ref <5.0)
Triglycerides: 224 mg/dL — ABNORMAL HIGH (ref <150)

## 2021-10-14 LAB — CBC WITH DIFFERENTIAL/PLATELET
Absolute Monocytes: 578 cells/uL (ref 200–950)
Basophils Absolute: 30 cells/uL (ref 0–200)
Basophils Relative: 0.5 %
Eosinophils Absolute: 171 cells/uL (ref 15–500)
Eosinophils Relative: 2.9 %
HCT: 45.2 % (ref 38.5–50.0)
Hemoglobin: 14.9 g/dL (ref 13.2–17.1)
Lymphs Abs: 1652 cells/uL (ref 850–3900)
MCH: 29.8 pg (ref 27.0–33.0)
MCHC: 33 g/dL (ref 32.0–36.0)
MCV: 90.4 fL (ref 80.0–100.0)
MPV: 9.5 fL (ref 7.5–12.5)
Monocytes Relative: 9.8 %
Neutro Abs: 3469 cells/uL (ref 1500–7800)
Neutrophils Relative %: 58.8 %
Platelets: 248 10*3/uL (ref 140–400)
RBC: 5 10*6/uL (ref 4.20–5.80)
RDW: 12.7 % (ref 11.0–15.0)
Total Lymphocyte: 28 %
WBC: 5.9 10*3/uL (ref 3.8–10.8)

## 2021-10-15 ENCOUNTER — Telehealth: Payer: Self-pay

## 2021-10-15 NOTE — Telephone Encounter (Signed)
Initiated Prior authorization [id number] 50MG  capsules Via: Covermymeds Case/Key: [id number] Status: approved  as of 2023 Reason: approved through 10/16/2022 Notified Pt via: Mychart

## 2021-11-04 ENCOUNTER — Other Ambulatory Visit (HOSPITAL_COMMUNITY): Payer: Self-pay

## 2021-11-09 ENCOUNTER — Encounter: Payer: Self-pay | Admitting: Family Medicine

## 2021-11-09 MED ORDER — CELECOXIB 200 MG PO CAPS: ORAL_CAPSULE | ORAL | 2 refills | Status: DC

## 2021-12-21 ENCOUNTER — Other Ambulatory Visit: Payer: Self-pay | Admitting: Medical-Surgical

## 2021-12-21 DIAGNOSIS — F902 Attention-deficit hyperactivity disorder, combined type: Secondary | ICD-10-CM

## 2021-12-21 MED ORDER — LISDEXAMFETAMINE DIMESYLATE 50 MG PO CAPS: 50.0000 mg | ORAL_CAPSULE | Freq: Every day | ORAL | 0 refills | Status: DC

## 2022-01-11 NOTE — Progress Notes (Unsigned)
   Established Patient Office Visit  Subjective   Patient ID: Henry Marshall, male   DOB: 01-12-1981 Age: 41 y.o. MRN: [id number]   No chief complaint on file.   HPI Pleasant 41 year old male presenting today for the following:  ADHD:  Mood:  Objective:    There were no vitals filed for this visit.  Physical Exam   No results found for this or any previous visit (from the past 24 hour(s)).   {Labs (Optional):23779}  The 10-year ASCVD risk score (Arnett DK, et al., 2019) is: 3.2%   Values used to calculate the score:     Age: 41 years     Sex: Male     Is Non-Hispanic African American: No     Diabetic: No     Tobacco smoker: No     Systolic Blood Pressure: 141 mmHg     Is BP treated: No     HDL Cholesterol: 45 mg/dL     Total Cholesterol: 247 mg/dL   Assessment & Plan:   No problem-specific Assessment & Plan notes found for this encounter.   No follow-ups on file.  ___________________________________________ Thayer Ohm, DNP, APRN, FNP-BC Primary Care and Sports Medicine [facility]

## 2022-01-13 ENCOUNTER — Encounter: Payer: BC Managed Care – PPO | Admitting: Medical-Surgical

## 2022-01-13 ENCOUNTER — Encounter: Payer: Self-pay | Admitting: Family Medicine

## 2022-01-13 ENCOUNTER — Encounter: Payer: Self-pay | Admitting: Medical-Surgical

## 2022-01-13 DIAGNOSIS — F902 Attention-deficit hyperactivity disorder, combined type: Secondary | ICD-10-CM

## 2022-01-13 DIAGNOSIS — F411 Generalized anxiety disorder: Secondary | ICD-10-CM

## 2022-01-13 DIAGNOSIS — F1320 Sedative, hypnotic or anxiolytic dependence, uncomplicated: Secondary | ICD-10-CM

## 2022-01-25 ENCOUNTER — Ambulatory Visit: Payer: BC Managed Care – PPO | Admitting: Family Medicine

## 2022-01-27 ENCOUNTER — Telehealth (INDEPENDENT_AMBULATORY_CARE_PROVIDER_SITE_OTHER): Payer: BC Managed Care – PPO | Admitting: Medical-Surgical

## 2022-01-27 DIAGNOSIS — F902 Attention-deficit hyperactivity disorder, combined type: Secondary | ICD-10-CM

## 2022-01-27 DIAGNOSIS — F411 Generalized anxiety disorder: Secondary | ICD-10-CM | POA: Diagnosis not present

## 2022-01-27 MED ORDER — ALPRAZOLAM 0.5 MG PO TABS: ORAL_TABLET | ORAL | 2 refills | Status: DC

## 2022-01-27 MED ORDER — LISDEXAMFETAMINE DIMESYLATE 50 MG PO CAPS: 50.0000 mg | ORAL_CAPSULE | Freq: Every day | ORAL | 0 refills | Status: DC

## 2022-01-27 MED ORDER — ESCITALOPRAM OXALATE 10 MG PO TABS: 10.0000 mg | ORAL_TABLET | Freq: Every day | ORAL | 1 refills | Status: DC

## 2022-01-27 NOTE — Progress Notes (Signed)
Virtual Visit via Video Note  I connected with Henry Marshall on 01/27/22 at 10:30 AM EDT by a video enabled telemedicine application and verified that I am speaking with the correct person using two identifiers.   I discussed the limitations of evaluation and management by telemedicine and the availability of in person appointments. The patient expressed understanding and agreed to proceed.  Patient location: home Provider locations: office  Subjective:    CC: Mood/ADHD follow-up  HPI: Pleasant 41 year old male presenting via MyChart video visit today for follow-up on:  Mood: Taking Lexapro 10 mg daily and using Xanax very sparingly, no more than 3 times weekly.  Feels the medication is working well for him overall and he is stable on this current regimen.  Denies SI/HI.  ADHD: Taking Vyvanse 50 mg daily, tolerating well without significant side effects.  Notes that he has not had any big issues getting this filled although he did have to wait once until his shipment came in.  No changes in appetite, weight fluctuations, sleeping pattern.  No palpitations or other intolerances.  Has not been checking his blood pressure but is aware that his blood pressure was elevated at his last in office visit.  Past medical history, Surgical history, Family history not pertinant except as noted below, Social history, Allergies, and medications have been entered into the medical record, reviewed, and corrections made.   Review of Systems: See HPI for pertinent positives and negatives.   Objective:    General: Speaking clearly in complete sentences without any shortness of breath.  Alert and oriented x3.  Normal judgment. No apparent acute distress.  Impression and Recommendations:    1. GAD (generalized anxiety disorder) Stable on current regimen.  Continue Lexapro and alprazolam as prescribed. - ALPRAZolam (XANAX) 0.5 MG tablet; TAKE 1 TABLET BY MOUTH ONCE DAILY AS NEEDED FOR ANXIETY. LIMIT USE TO  NO MORE THAN 3 TIMES WEEKLY. 15 TABLETS MUST LAST AT LEAST 30 DAYS.  Dispense: 15 tablet; Refill: 2 - escitalopram (LEXAPRO) 10 MG tablet; Take 1 tablet (10 mg total) by mouth daily. Take 1 tablet by mouth once daily  Dispense: 90 tablet; Refill: 1  2. Attention deficit hyperactivity disorder (ADHD), combined type Refilling Vyvanse at 50 mg daily.  Recommend monitoring his blood pressure periodically.  He notes that he will go to [facility] tomorrow and use their machine to check his blood pressure.  He will send this to me in a MyChart message so we have an updated record.  Advised that this medication is a stimulant and can increase his blood pressure to unsafe levels.  If this does occur, we may need to look at starting a medication to help manage his blood pressure in conjunction with lifestyle modifications. - lisdexamfetamine (VYVANSE) 50 MG capsule; Take 1 capsule (50 mg total) by mouth daily.  Dispense: 30 capsule; Refill: 0  I discussed the assessment and treatment plan with the patient. The patient was provided an opportunity to ask questions and all were answered. The patient agreed with the plan and demonstrated an understanding of the instructions.   The patient was advised to call back or seek an in-person evaluation if the symptoms worsen or if the condition fails to improve as anticipated.  20 minutes of non-face-to-face time was provided during this encounter.  Return in about 3 months (around 04/28/2022) for ADHD follow up.  Thayer Ohm, DNP, APRN, FNP-BC [facility]

## 2022-03-21 ENCOUNTER — Encounter: Payer: Self-pay | Admitting: Medical-Surgical

## 2022-03-21 DIAGNOSIS — F902 Attention-deficit hyperactivity disorder, combined type: Secondary | ICD-10-CM

## 2022-03-21 MED ORDER — LISDEXAMFETAMINE DIMESYLATE 50 MG PO CAPS: 50.0000 mg | ORAL_CAPSULE | Freq: Every day | ORAL | 0 refills | Status: DC

## 2022-03-24 MED ORDER — LISDEXAMFETAMINE DIMESYLATE 50 MG PO CAPS: 50.0000 mg | ORAL_CAPSULE | Freq: Every day | ORAL | 0 refills | Status: DC

## 2022-03-24 NOTE — Addendum Note (Signed)
Addended by: Narda Rutherford on: 03/24/2022 07:55 AM   Modules accepted: Orders

## 2022-03-24 NOTE — Addendum Note (Signed)
Addended bySamuel Bouche on: 03/24/2022 07:58 AM   Modules accepted: Orders

## 2022-03-24 NOTE — Telephone Encounter (Signed)
Cancelled the prescription.

## 2022-03-24 NOTE — Telephone Encounter (Signed)
New prescription sent to the [facility]. Please call the [facility] to cancel the prescription that was previously sent.

## 2022-04-07 ENCOUNTER — Ambulatory Visit: Payer: BC Managed Care – PPO | Admitting: Family Medicine

## 2022-04-07 ENCOUNTER — Encounter: Payer: Self-pay | Admitting: Family Medicine

## 2022-04-07 NOTE — Progress Notes (Unsigned)
   I, Philbert Riser, LAT, ATC acting as a scribe for Henry Graham, MD.  Henry Marshall is a 41 y.o. male who presents to [facility] at [facility] today for f/u bilat shoulder pain and new R hip pain. Pt was last seen by Dr. [name] on 09/06/21 and was given bilat AC joint steroid injections. Pt sent a MyChart message on 4/17, reporting worsening shoulder pain and was prescribed Hydrocodone. Pt sent another MyChart message on 6/20 reporting new R hip pain and a rx for Celebrex was prescribed. Pt locates pain to the lateral aspect of his R hip. Pt notes pain will just randomly send a "jolt" down his leg.    Radiates: yes LE Numbness/tingling: no LE Weakness: yes Aggravates: stairs, fast paced walking Treatments tried: Tylenol, IBU, Celebrex  Pt would also like to discuss bilat shoulder pain, L>R. Pt notes shoulder pain returned over the last few weeks. Pt locates pain to the superior aspect of the L shoulder and is experiencing mechanical symptoms w/ certain shoulder AROM. Pt c/o L shoulder pain keeping him up at night.  Dx imaging: 09/06/21 R shoulder XR  03/18/21 L shoulder XR   Pertinent review of systems: ***  Relevant historical information: ***   Exam:  There were no vitals taken for this visit. General: Well Developed, well nourished, and in no acute distress.   MSK: ***    Lab and Radiology Results No results found for this or any previous visit (from the past 72 hour(s)). No results found.     Assessment and Plan: 41 y.o. male with ***   PDMP not reviewed this encounter. No orders of the defined types were placed in this encounter.  No orders of the defined types were placed in this encounter.    Discussed warning signs or symptoms. Please see discharge instructions. Patient expresses understanding.   ***

## 2022-04-07 NOTE — Progress Notes (Deleted)
   I, Philbert Riser, LAT, ATC acting as a scribe for Henry Graham, MD.  Henry Marshall is a 41 y.o. male who presents to [facility] at [facility] today for f/u bilat shoulder pain and new R hip pain. Pt was last seen by Dr. [name] on 09/06/21 and was given bilat AC joint steroid injections. Pt sent a MyChart message on 4/17, reporting worsening shoulder pain and was prescribed Hydrocodone. Pt sent another MyChart message on 6/20 reporting new R hip pain and a rx for Celebrex was prescribed. Pt locates pain to    Radiates:  LE Numbness/tingling: LE Weakness: Aggravates: Treatments tried:  Dx imaging: 09/06/21 R shoulder XR  03/18/21 L shoulder XR  Pertinent review of systems: ***  Relevant historical information: ***   Exam:  There were no vitals taken for this visit. General: Well Developed, well nourished, and in no acute distress.   MSK: ***    Lab and Radiology Results No results found for this or any previous visit (from the past 72 hour(s)). No results found.     Assessment and Plan: 41 y.o. male with ***   PDMP not reviewed this encounter. No orders of the defined types were placed in this encounter.  No orders of the defined types were placed in this encounter.    Discussed warning signs or symptoms. Please see discharge instructions. Patient expresses understanding.   ***

## 2022-04-11 ENCOUNTER — Ambulatory Visit (INDEPENDENT_AMBULATORY_CARE_PROVIDER_SITE_OTHER): Payer: BC Managed Care – PPO

## 2022-04-11 ENCOUNTER — Ambulatory Visit: Payer: Self-pay

## 2022-04-11 ENCOUNTER — Ambulatory Visit (INDEPENDENT_AMBULATORY_CARE_PROVIDER_SITE_OTHER): Payer: BC Managed Care – PPO | Admitting: Family Medicine

## 2022-04-11 VITALS — BP 168/92 | HR 86 | Ht 65.0 in | Wt 220.0 lb

## 2022-04-11 DIAGNOSIS — M87051 Idiopathic aseptic necrosis of right femur: Secondary | ICD-10-CM | POA: Diagnosis not present

## 2022-04-11 DIAGNOSIS — M722 Plantar fascial fibromatosis: Secondary | ICD-10-CM

## 2022-04-11 DIAGNOSIS — M25551 Pain in right hip: Secondary | ICD-10-CM

## 2022-04-11 DIAGNOSIS — M25511 Pain in right shoulder: Secondary | ICD-10-CM | POA: Diagnosis not present

## 2022-04-11 DIAGNOSIS — M25512 Pain in left shoulder: Secondary | ICD-10-CM | POA: Diagnosis not present

## 2022-04-11 DIAGNOSIS — G8929 Other chronic pain: Secondary | ICD-10-CM

## 2022-04-11 MED ORDER — CELECOXIB 200 MG PO CAPS: ORAL_CAPSULE | ORAL | 2 refills | Status: DC

## 2022-04-11 NOTE — Patient Instructions (Addendum)
Thank you for coming in today.   You received an injection today. Seek immediate medical attention if the joint becomes red, extremely painful, or is oozing fluid.   Please complete the exercises that the athletic trainer went over with you: View at [email or web address] using code:    Please get an Xray today before you leave   Check back as needed

## 2022-04-12 DIAGNOSIS — M87051 Idiopathic aseptic necrosis of right femur: Secondary | ICD-10-CM | POA: Insufficient documentation

## 2022-04-12 HISTORY — DX: Idiopathic aseptic necrosis of right femur: M87.051

## 2022-04-13 NOTE — Progress Notes (Signed)
Right hip shows worsening avascular necrosis.

## 2022-05-03 ENCOUNTER — Other Ambulatory Visit: Payer: Self-pay | Admitting: Medical-Surgical

## 2022-05-03 DIAGNOSIS — F902 Attention-deficit hyperactivity disorder, combined type: Secondary | ICD-10-CM

## 2022-05-03 NOTE — Telephone Encounter (Signed)
Overdue for office visit.  Was instructed to return in 3 months (approximately 04/28/2022) for follow-up on ADHD.  Would be willing to send in 1 further 30-day supply if he gets scheduled for follow-up.

## 2022-05-03 NOTE — Telephone Encounter (Signed)
L.O.V: 01/27/22 (Vv), 10/13/21 (Ov)  N.O.V: Not scheduled   L.R.F: 03/24/22 30 cap 0 refill

## 2022-05-05 ENCOUNTER — Encounter: Payer: Self-pay | Admitting: Family Medicine

## 2022-05-06 MED ORDER — TIZANIDINE HCL 4 MG PO TABS: 4.0000 mg | ORAL_TABLET | Freq: Four times a day (QID) | ORAL | 1 refills | Status: DC | PRN

## 2022-05-09 ENCOUNTER — Encounter: Payer: Self-pay | Admitting: Medical-Surgical

## 2022-05-18 ENCOUNTER — Encounter: Payer: Self-pay | Admitting: Medical-Surgical

## 2022-05-18 ENCOUNTER — Telehealth: Payer: BC Managed Care – PPO | Admitting: Medical-Surgical

## 2022-05-18 DIAGNOSIS — F411 Generalized anxiety disorder: Secondary | ICD-10-CM | POA: Diagnosis not present

## 2022-05-18 DIAGNOSIS — F902 Attention-deficit hyperactivity disorder, combined type: Secondary | ICD-10-CM | POA: Diagnosis not present

## 2022-05-18 MED ORDER — LISDEXAMFETAMINE DIMESYLATE 50 MG PO CAPS: 50.0000 mg | ORAL_CAPSULE | Freq: Every day | ORAL | 0 refills | Status: DC

## 2022-05-18 MED ORDER — ESCITALOPRAM OXALATE 10 MG PO TABS: 10.0000 mg | ORAL_TABLET | Freq: Every day | ORAL | 1 refills | Status: DC

## 2022-05-18 MED ORDER — ALPRAZOLAM 0.5 MG PO TABS: ORAL_TABLET | ORAL | 2 refills | Status: DC

## 2022-05-18 NOTE — Progress Notes (Signed)
Virtual Visit via Video Note  I connected with Henry Marshall on 05/18/22 at 10:30 AM EST by a video enabled telemedicine application and verified that I am speaking with the correct person using two identifiers.   I discussed the limitations of evaluation and management by telemedicine and the availability of in person appointments. The patient expressed understanding and agreed to proceed.  Patient location: home Provider locations: office  Subjective:    CC: ADD follow-up  HPI: Pleasant 41 year old male presenting via MyChart video visit for the following:  ADD: Taking Vyvanse 50 mg daily, tolerating well without side effects.  Has been under this regimen for a couple of years.  Notes that the medication is still working well for him and he has no changes in sleep pattern, appetite, or weight.  Denies worsened anxiety, palpitations, constipation, chest pain, and shortness of breath.  Mood: Taking Lexapro 10 mg daily, tolerating well without side effects.  Feels this is working to keep his mood fairly stable but does have some breakthrough anxiety.  He uses Xanax 0.5 mg for severe anxiety and is aware of the recommendation for using it no more than 3 times weekly.   Past medical history, Surgical history, Family history not pertinant except as noted below, Social history, Allergies, and medications have been entered into the medical record, reviewed, and corrections made.   Review of Systems: See HPI for pertinent positives and negatives.   Objective:    General: Speaking clearly in complete sentences without any shortness of breath.  Alert and oriented x3.  Normal judgment. No apparent acute distress.  Impression and Recommendations:    1. GAD (generalized anxiety disorder) Continue Lexapro 10 mg daily.  Refilling alprazolam for very sparing use. - ALPRAZolam (XANAX) 0.5 MG tablet; TAKE 1 TABLET BY MOUTH ONCE DAILY AS NEEDED FOR ANXIETY. LIMIT USE TO NO MORE THAN 3 TIMES WEEKLY. 15  TABLETS MUST LAST AT LEAST 30 DAYS.  Dispense: 15 tablet; Refill: 2 - escitalopram (LEXAPRO) 10 MG tablet; Take 1 tablet (10 mg total) by mouth daily. Take 1 tablet by mouth once daily  Dispense: 90 tablet; Refill: 1  2. Attention deficit hyperactivity disorder (ADHD), combined type Symptoms well-controlled on current regimen.  Continue Vyvanse 50 mg daily. - lisdexamfetamine (VYVANSE) 50 MG capsule; Take 1 capsule (50 mg total) by mouth daily.  Dispense: 30 capsule; Refill: 0 - lisdexamfetamine (VYVANSE) 50 MG capsule; Take 1 capsule (50 mg total) by mouth daily.  Dispense: 30 capsule; Refill: 0 - lisdexamfetamine (VYVANSE) 50 MG capsule; Take 1 capsule (50 mg total) by mouth daily.  Dispense: 30 capsule; Refill: 0   I discussed the assessment and treatment plan with the patient. The patient was provided an opportunity to ask questions and all were answered. The patient agreed with the plan and demonstrated an understanding of the instructions.   The patient was advised to call back or seek an in-person evaluation if the symptoms worsen or if the condition fails to improve as anticipated.  20 minutes of non-face-to-face time was provided during this encounter.  Return in about 6 months (around 11/17/2022) for Mood/ADHD follow-up.  Thayer Ohm, DNP, APRN, FNP-BC [facility]

## 2022-06-01 ENCOUNTER — Encounter: Payer: Self-pay | Admitting: Medical-Surgical

## 2022-06-03 MED ORDER — LISINOPRIL 10 MG PO TABS: 10.0000 mg | ORAL_TABLET | Freq: Every day | ORAL | 3 refills | Status: DC

## 2022-07-02 ENCOUNTER — Encounter: Payer: Self-pay | Admitting: Medical-Surgical

## 2022-07-26 ENCOUNTER — Encounter: Payer: Self-pay | Admitting: Family Medicine

## 2022-07-28 MED ORDER — CELECOXIB 200 MG PO CAPS: ORAL_CAPSULE | ORAL | 2 refills | Status: DC

## 2022-07-28 NOTE — Telephone Encounter (Signed)
I was unable to answer pt's needs, so Dr. Georgina Snell spoke to pt, refilled rx.

## 2022-07-28 NOTE — Telephone Encounter (Signed)
Called pt and left a VM asking for clarity in what he would like to be done/ordered. Brandy or I will need to speak to him if he calls back.

## 2022-09-28 ENCOUNTER — Ambulatory Visit: Payer: BC Managed Care – PPO | Admitting: Medical-Surgical

## 2022-09-28 ENCOUNTER — Encounter: Payer: Self-pay | Admitting: Medical-Surgical

## 2022-09-28 VITALS — BP 117/79 | HR 98 | Resp 20 | Ht 65.0 in | Wt 225.0 lb

## 2022-09-28 DIAGNOSIS — F902 Attention-deficit hyperactivity disorder, combined type: Secondary | ICD-10-CM | POA: Diagnosis not present

## 2022-09-28 DIAGNOSIS — R7303 Prediabetes: Secondary | ICD-10-CM | POA: Diagnosis not present

## 2022-09-28 DIAGNOSIS — Z01818 Encounter for other preprocedural examination: Secondary | ICD-10-CM | POA: Diagnosis not present

## 2022-09-28 DIAGNOSIS — M87051 Idiopathic aseptic necrosis of right femur: Secondary | ICD-10-CM

## 2022-09-28 DIAGNOSIS — F1320 Sedative, hypnotic or anxiolytic dependence, uncomplicated: Secondary | ICD-10-CM

## 2022-09-28 DIAGNOSIS — F411 Generalized anxiety disorder: Secondary | ICD-10-CM | POA: Diagnosis not present

## 2022-09-28 LAB — CBC WITH DIFFERENTIAL/PLATELET
Basophils Relative: 0.5 %
Eosinophils Relative: 3.9 %
HCT: 44.4 % (ref 38.5–50.0)
MCH: 28.9 pg (ref 27.0–33.0)
MPV: 9.3 fL (ref 7.5–12.5)
Platelets: 274 10*3/uL (ref 140–400)
RBC: 5.05 10*6/uL (ref 4.20–5.80)

## 2022-09-28 MED ORDER — LISDEXAMFETAMINE DIMESYLATE 50 MG PO CAPS
50.0000 mg | ORAL_CAPSULE | Freq: Every day | ORAL | 0 refills | Status: DC
Start: 2022-09-28 — End: 2023-05-05

## 2022-09-28 MED ORDER — ALPRAZOLAM 0.5 MG PO TABS: ORAL_TABLET | ORAL | 2 refills | Status: DC

## 2022-09-28 MED ORDER — LISDEXAMFETAMINE DIMESYLATE 50 MG PO CAPS: 50.0000 mg | ORAL_CAPSULE | Freq: Every day | ORAL | 0 refills | Status: DC

## 2022-09-28 NOTE — Progress Notes (Signed)
Established Patient Office Visit  Subjective   Patient ID: Henry Marshall, male   DOB: 01-Dec-1980 Age: 42 y.o. MRN: [id number]   Chief Complaint  Patient presents with   Form Completion    SURGICAL CLEARANCE   HPI Pleasant 42 year old male presenting today for the following:  ADHD: doing well on vyvanse 50mg  daily. Denies SE and intolerances. Sleeping well. No appetite changes or unexpected weight fluctuations.   Anxiety: has intermittent anxiety attacks that are managed by infrequent use of Xanax. Feels the medication works well and does not leave him with SE or cause grogginess. Aware of risks for tolerance and dependence. Compliant with recommendations for no more than 2-3 doses per week and only for severe anxiety. Denies SI/HI.   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 Has already had his left hip replacement done. Now plans to have his right hip done. His surgeon is with the [facility] and the procedure will be done in [place]. It is not scheduled yet pending clearance but he reports it will likely happen in August.   Review of Systems  Constitutional:  Negative for chills, fever, malaise/fatigue and weight loss.  HENT:  Negative for congestion, ear pain, hearing loss, sinus pain and sore throat.   Eyes:  Negative for blurred vision, photophobia and pain.  Respiratory:  Negative for cough, shortness of breath and wheezing.   Cardiovascular:  Negative for chest pain, palpitations and leg swelling.  Gastrointestinal:  Negative for abdominal pain, constipation, diarrhea, heartburn, nausea and vomiting.  Genitourinary:  Negative for dysuria, frequency and urgency.  Musculoskeletal:  Positive for joint pain (right hip). Negative for falls and neck pain.  Skin:  Negative for itching and rash.  Neurological:  Negative for dizziness, weakness and headaches.  Endo/Heme/Allergies:  Negative for polydipsia. Does not bruise/bleed easily.   Psychiatric/Behavioral:  Negative for depression, substance abuse and suicidal ideas. The patient is nervous/anxious. The patient does not have insomnia.     Objective:    Vitals:   09/28/22 1056  BP: 117/79  Pulse: 98  Resp: 20  Height: 5\' 5"  (1.651 m)  Weight: 225 lb (102.1 kg)  SpO2: 96%  BMI (Calculated): 37.44   Physical Exam Vitals reviewed.  Constitutional:      General: He is not in acute distress.    Appearance: Normal appearance. He is obese. He is not ill-appearing.  HENT:     Head: Normocephalic and atraumatic.     Nose: Nose normal.     Mouth/Throat:     Mouth: Mucous membranes are moist.     Pharynx: No oropharyngeal exudate or posterior oropharyngeal erythema.  Eyes:     General: No scleral icterus.       Right eye: No discharge.        Left eye: No discharge.     Extraocular Movements: Extraocular movements intact.     Conjunctiva/sclera: Conjunctivae normal.     Pupils: Pupils are equal, round, and reactive to light.  Cardiovascular:     Rate and Rhythm: Normal rate and regular rhythm.     Pulses: Normal pulses.     Heart sounds: Normal heart sounds. No murmur heard.    No friction rub. No gallop.  Pulmonary:     Effort: Pulmonary effort is normal. No respiratory distress.     Breath sounds: Normal breath sounds.  Abdominal:     General: Bowel sounds are normal. There is no distension.     Palpations: Abdomen  is soft.     Tenderness: There is no abdominal tenderness.  Musculoskeletal:     Cervical back: Normal range of motion and neck supple. No tenderness.     Right lower leg: No edema.     Left lower leg: No edema.  Lymphadenopathy:     Cervical: No cervical adenopathy.  Skin:    General: Skin is warm and dry.  Neurological:     Mental Status: He is alert and oriented to person, place, and time.  Psychiatric:        Mood and Affect: Mood normal.        Behavior: Behavior normal.        Thought Content: Thought content normal.         Judgment: Judgment normal.   No results found for this or any previous visit (from the past 24 hour(s)).     The 10-year ASCVD risk score (Arnett DK, et al., 2019) is: 2.6%   Values used to calculate the score:     Age: 48 years     Sex: Male     Is Non-Hispanic African American: No     Diabetic: No     Tobacco smoker: No     Systolic Blood Pressure: 117 mmHg     Is BP treated: No     HDL Cholesterol: 45 mg/dL     Total Cholesterol: 247 mg/dL   Assessment & Plan:   1. GAD (generalized anxiety disorder) Continue sparing use of Xanax only for severe anxiety.  - ALPRAZolam (XANAX) 0.5 MG tablet; TAKE 1 TABLET BY MOUTH ONCE DAILY AS NEEDED FOR ANXIETY. LIMIT USE TO NO MORE THAN 3 TIMES WEEKLY. 15 TABLETS MUST LAST AT LEAST 30 DAYS.  Dispense: 15 tablet; Refill: 2  2. Attention deficit hyperactivity disorder (ADHD), combined type Doing well on a stable regimen. Refilling Vyvanse 50mg  daily.  - lisdexamfetamine (VYVANSE) 50 MG capsule; Take 1 capsule (50 mg total) by mouth daily.  Dispense: 30 capsule; Refill: 0 - lisdexamfetamine (VYVANSE) 50 MG capsule; Take 1 capsule (50 mg total) by mouth daily.  Dispense: 30 capsule; Refill: 0 - lisdexamfetamine (VYVANSE) 50 MG capsule; Take 1 capsule (50 mg total) by mouth daily.  Dispense: 30 capsule; Refill: 0  3. Preoperative clearance Forms reviewed. Labs ordered as requested to be done today. Medically optimized for surgery. Able to exercise/perform strenuous activities without CP or dyspnea. Able to climb stairs without difficulty outside of right hip pain. Considered a low risk for a low to moderate risk surgery. Clearance to proceed with planned surgery granted.  - CBC with Differential/Platelet - COMPLETE METABOLIC PANEL WITH GFR - Hemoglobin A1c  4. Avascular necrosis of bone of right hip (HCC) Proceed with upcoming surgery.   5. Benzodiazepine dependence (HCC) See above. Aware of risks of chronic use of benzodiazepines. We have  discussed maintenance medications in the past but he is not at a point that he wants to start something. Plan to continue to discuss with the goal of being able to discontinue Xanax.  Return in about 3 months (around 12/29/2022) for ADHD follow up.  ___________________________________________ Thayer Ohm, DNP, APRN, FNP-BC Primary [facility] and Sports Medicine [facility]

## 2022-09-29 LAB — HEMOGLOBIN A1C
Hgb A1c MFr Bld: 5.7 % of total Hgb — ABNORMAL HIGH (ref <5.7)
Mean Plasma Glucose: 117 mg/dL
eAG (mmol/L): 6.5 mmol/L

## 2022-09-29 LAB — CBC WITH DIFFERENTIAL/PLATELET
Absolute Monocytes: 650 cells/uL (ref 200–950)
Basophils Absolute: 28 cells/uL (ref 0–200)
Eosinophils Absolute: 218 cells/uL (ref 15–500)
Hemoglobin: 14.6 g/dL (ref 13.2–17.1)
Lymphs Abs: 2055 cells/uL (ref 850–3900)
MCHC: 32.9 g/dL (ref 32.0–36.0)
MCV: 87.9 fL (ref 80.0–100.0)
Monocytes Relative: 11.6 %
Neutro Abs: 2649 cells/uL (ref 1500–7800)
Neutrophils Relative %: 47.3 %
RDW: 13 % (ref 11.0–15.0)
Total Lymphocyte: 36.7 %
WBC: 5.6 10*3/uL (ref 3.8–10.8)

## 2022-09-29 LAB — COMPLETE METABOLIC PANEL WITH GFR
AG Ratio: 1.8 (calc) (ref 1.0–2.5)
ALT: 24 U/L (ref 9–46)
AST: 16 U/L (ref 10–40)
Albumin: 4.2 g/dL (ref 3.6–5.1)
Alkaline phosphatase (APISO): 74 U/L (ref 36–130)
BUN: 16 mg/dL (ref 7–25)
CO2: 24 mmol/L (ref 20–32)
Calcium: 9.5 mg/dL (ref 8.6–10.3)
Chloride: 106 mmol/L (ref 98–110)
Creat: 0.96 mg/dL (ref 0.60–1.29)
Globulin: 2.4 g/dL (calc) (ref 1.9–3.7)
Glucose, Bld: 91 mg/dL (ref 65–99)
Potassium: 3.9 mmol/L (ref 3.5–5.3)
Sodium: 143 mmol/L (ref 135–146)
Total Bilirubin: 0.5 mg/dL (ref 0.2–1.2)
Total Protein: 6.6 g/dL (ref 6.1–8.1)
eGFR: 100 mL/min/{1.73_m2} (ref >=60)

## 2022-10-13 ENCOUNTER — Encounter: Payer: Self-pay | Admitting: Medical-Surgical

## 2022-10-13 ENCOUNTER — Ambulatory Visit (INDEPENDENT_AMBULATORY_CARE_PROVIDER_SITE_OTHER): Payer: BC Managed Care – PPO | Admitting: Medical-Surgical

## 2022-10-13 VITALS — BP 123/78 | HR 82 | Resp 20 | Ht 65.0 in | Wt 222.1 lb

## 2022-10-13 DIAGNOSIS — Z01818 Encounter for other preprocedural examination: Secondary | ICD-10-CM | POA: Diagnosis not present

## 2022-10-13 NOTE — Progress Notes (Signed)
Pleasant 42 year old male presenting today for completion of an EKG as part of his preoperative clearance requirements.  No cardiac concerns or significant cardiac history.  EKG completed in office today showing normal sinus rhythm, rightward axis, rate 84.  No acute changes.  Faxing EKG to his surgeons office to complete a preoperative clearance requirements.  Please note: No charge for this appointment. ___________________________________________ Thayer Ohm, DNP, APRN, FNP-BC [facility] and Sports Medicine [facility] [place]

## 2022-10-31 ENCOUNTER — Encounter: Payer: Self-pay | Admitting: Family Medicine

## 2022-11-01 ENCOUNTER — Telehealth: Payer: Self-pay | Admitting: Family Medicine

## 2022-11-01 MED ORDER — HYDROCODONE-ACETAMINOPHEN 5-325 MG PO TABS: 1.0000 | ORAL_TABLET | Freq: Every day | ORAL | 0 refills | Status: DC | PRN

## 2022-11-01 NOTE — Telephone Encounter (Signed)
Controlled substance, forwarding to Dr. Corey  

## 2022-11-01 NOTE — Telephone Encounter (Signed)
Arlys John at [facility] pharmacy called about pain meds Dr. [name] prescribed. Because pt has not taken these types of meds before, pharmacy can only fill 5 as the initial rx due to the Stop Act. Pharmacist recommends sending addtl rx for the other 25.

## 2022-11-02 MED ORDER — HYDROCODONE-ACETAMINOPHEN 5-325 MG PO TABS: 1.0000 | ORAL_TABLET | Freq: Every day | ORAL | 0 refills | Status: AC | PRN

## 2022-11-02 NOTE — Telephone Encounter (Signed)
Sent the medicines again

## 2022-11-08 ENCOUNTER — Encounter: Payer: Self-pay | Admitting: Medical-Surgical

## 2022-11-08 ENCOUNTER — Telehealth: Payer: Self-pay

## 2022-11-08 NOTE — Telephone Encounter (Signed)
Hydrocodone-APAP 5-325 mg tab Qty  CoverMyMeds KEY [id number]

## 2022-11-09 NOTE — Telephone Encounter (Signed)
Prior Authorization initiated for Hydrocodone-APAP via [email or web address] KEY: [id number]

## 2022-11-09 NOTE — Telephone Encounter (Signed)
Prior auth APPROVED  PA# [id number] Valid: 11/09/22-05/11/23

## 2023-01-02 DIAGNOSIS — R7303 Prediabetes: Secondary | ICD-10-CM | POA: Insufficient documentation

## 2023-01-02 DIAGNOSIS — Z8679 Personal history of other diseases of the circulatory system: Secondary | ICD-10-CM | POA: Insufficient documentation

## 2023-01-02 DIAGNOSIS — Z87442 Personal history of urinary calculi: Secondary | ICD-10-CM | POA: Insufficient documentation

## 2023-01-12 ENCOUNTER — Encounter: Payer: Self-pay | Admitting: Family Medicine

## 2023-01-16 ENCOUNTER — Other Ambulatory Visit: Payer: Self-pay

## 2023-01-16 DIAGNOSIS — G8929 Other chronic pain: Secondary | ICD-10-CM

## 2023-01-17 ENCOUNTER — Other Ambulatory Visit: Payer: Self-pay

## 2023-01-17 ENCOUNTER — Ambulatory Visit: Payer: BC Managed Care – PPO | Admitting: Physical Therapy

## 2023-01-17 ENCOUNTER — Encounter: Payer: Self-pay | Admitting: Physical Therapy

## 2023-01-17 DIAGNOSIS — R262 Difficulty in walking, not elsewhere classified: Secondary | ICD-10-CM | POA: Diagnosis not present

## 2023-01-17 DIAGNOSIS — M6281 Muscle weakness (generalized): Secondary | ICD-10-CM | POA: Insufficient documentation

## 2023-01-17 DIAGNOSIS — G8929 Other chronic pain: Secondary | ICD-10-CM | POA: Diagnosis not present

## 2023-01-17 DIAGNOSIS — M25551 Pain in right hip: Secondary | ICD-10-CM | POA: Insufficient documentation
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
## 2023-01-31 ENCOUNTER — Encounter: Payer: Self-pay | Admitting: Family Medicine

## 2023-02-01 ENCOUNTER — Encounter: Payer: Self-pay | Admitting: Physical Therapy

## 2023-02-01 ENCOUNTER — Ambulatory Visit: Payer: BC Managed Care – PPO | Attending: Family Medicine | Admitting: Physical Therapy

## 2023-02-01 DIAGNOSIS — R262 Difficulty in walking, not elsewhere classified: Secondary | ICD-10-CM | POA: Insufficient documentation

## 2023-02-01 DIAGNOSIS — M25551 Pain in right hip: Secondary | ICD-10-CM | POA: Diagnosis not present

## 2023-02-01 DIAGNOSIS — M6281 Muscle weakness (generalized): Secondary | ICD-10-CM | POA: Diagnosis not present
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
## 2023-02-15 ENCOUNTER — Ambulatory Visit: Payer: BC Managed Care – PPO

## 2023-02-15 ENCOUNTER — Encounter: Payer: Self-pay | Admitting: Physical Therapy

## 2023-02-15 ENCOUNTER — Encounter: Payer: Self-pay | Admitting: Family Medicine

## 2023-02-15 ENCOUNTER — Encounter: Payer: Self-pay | Admitting: Medical-Surgical

## 2023-02-15 ENCOUNTER — Ambulatory Visit: Payer: BC Managed Care – PPO | Admitting: Physical Therapy

## 2023-02-15 DIAGNOSIS — G8929 Other chronic pain: Secondary | ICD-10-CM

## 2023-02-15 DIAGNOSIS — M6281 Muscle weakness (generalized): Secondary | ICD-10-CM | POA: Diagnosis not present

## 2023-02-15 DIAGNOSIS — M25551 Pain in right hip: Secondary | ICD-10-CM

## 2023-02-15 DIAGNOSIS — R262 Difficulty in walking, not elsewhere classified: Secondary | ICD-10-CM | POA: Diagnosis not present

## 2023-02-15 DIAGNOSIS — Z96643 Presence of artificial hip joint, bilateral: Secondary | ICD-10-CM | POA: Diagnosis not present

## 2023-02-28 NOTE — Telephone Encounter (Signed)
Called radiology reading room and requested a read

## 2023-03-01 NOTE — Progress Notes (Signed)
Right hip x-ray shows well-appearing right total hip with replacement.  No fractures.  No evidence of hardware loosening

## 2023-05-01 ENCOUNTER — Other Ambulatory Visit: Payer: Self-pay | Admitting: Medical-Surgical

## 2023-05-01 DIAGNOSIS — F902 Attention-deficit hyperactivity disorder, combined type: Secondary | ICD-10-CM

## 2023-05-02 ENCOUNTER — Telehealth: Payer: BC Managed Care – PPO | Admitting: Medical-Surgical

## 2023-05-02 NOTE — Telephone Encounter (Signed)
Last filled 11/27/2022  Last office visit 10/13/2022  No upcoming appointments.

## 2023-05-04 ENCOUNTER — Other Ambulatory Visit: Payer: Self-pay

## 2023-05-04 ENCOUNTER — Encounter: Payer: Self-pay | Admitting: Medical-Surgical

## 2023-05-04 DIAGNOSIS — F902 Attention-deficit hyperactivity disorder, combined type: Secondary | ICD-10-CM

## 2023-05-04 NOTE — Telephone Encounter (Signed)
Patient overdue for an appointment

## 2023-05-04 NOTE — Telephone Encounter (Signed)
Patient requesting rx rf of vyvanse  Last written 11/27/2022 Last OV 10/13/2022 Upcoming appt - none

## 2023-05-04 NOTE — Telephone Encounter (Signed)
Copied from CRM [id number]. Topic: Clinical - Medication Refill >> May 01, 2023 10:33 AM Fonda Kinder J wrote: Most Recent Primary Care Visit:  Provider: Christen Butter  Department: [facility]  Visit Type: OFFICE VISIT  Date: 10/13/2022  Medication: VYVANSE  Has the patient contacted their pharmacy? Yes (Agent: If no, request that the patient contact the pharmacy for the refill. If patient does not wish to contact the pharmacy document the reason why and proceed with request.) (Agent: If yes, when and what did the pharmacy advise?) Contact PCP  Is this the correct pharmacy for this prescription? Yes If no, delete pharmacy and type the correct one.  This is the patient's preferred pharmacy:  [facility] [address] [place], [place] - [address] [place] [address] Phone: [phone] Fax: [phone]   Has the prescription been filled recently? No  Is the patient out of the medication? Yes  Has the patient been seen for an appointment in the last year OR does the patient have an upcoming appointment? No  Can we respond through MyChart? No  Agent: Please be advised that Rx refills may take up to 3 business days. We ask that you follow-up with your pharmacy.

## 2023-05-04 NOTE — Telephone Encounter (Signed)
Patient states that he was out of work for 3 months following a hip replacement.  He was scheduled for visit for 05/02/23 but provider was sick and this appointment was cancelled.  Scheduled patient for follow up visit with Joy for tomorrow 05/05/23 @ 3:20

## 2023-05-05 ENCOUNTER — Encounter: Payer: Self-pay | Admitting: Medical-Surgical

## 2023-05-05 ENCOUNTER — Telehealth (INDEPENDENT_AMBULATORY_CARE_PROVIDER_SITE_OTHER): Payer: BC Managed Care – PPO | Admitting: Medical-Surgical

## 2023-05-05 DIAGNOSIS — F411 Generalized anxiety disorder: Secondary | ICD-10-CM | POA: Diagnosis not present

## 2023-05-05 DIAGNOSIS — F902 Attention-deficit hyperactivity disorder, combined type: Secondary | ICD-10-CM | POA: Diagnosis not present

## 2023-05-05 MED ORDER — ALPRAZOLAM 0.5 MG PO TABS
ORAL_TABLET | ORAL | 2 refills | Status: DC
Start: 2023-05-05 — End: 2023-12-22

## 2023-05-05 MED ORDER — LISDEXAMFETAMINE DIMESYLATE 50 MG PO CAPS
50.0000 mg | ORAL_CAPSULE | Freq: Every day | ORAL | 0 refills | Status: DC
Start: 2023-07-04 — End: 2023-09-06

## 2023-05-05 MED ORDER — LISDEXAMFETAMINE DIMESYLATE 50 MG PO CAPS
50.0000 mg | ORAL_CAPSULE | Freq: Every day | ORAL | 0 refills | Status: DC
Start: 2023-06-04 — End: 2023-09-06

## 2023-05-05 MED ORDER — ESCITALOPRAM OXALATE 10 MG PO TABS
10.0000 mg | ORAL_TABLET | Freq: Every day | ORAL | 1 refills | Status: AC
Start: 2023-05-05 — End: ?

## 2023-05-05 MED ORDER — LISDEXAMFETAMINE DIMESYLATE 50 MG PO CAPS
50.0000 mg | ORAL_CAPSULE | Freq: Every day | ORAL | 0 refills | Status: DC
Start: 2023-05-05 — End: 2023-09-06

## 2023-05-05 NOTE — Progress Notes (Signed)
Virtual Visit via Video Note  I connected with Henry Marshall on 05/05/23 at  3:20 PM EST by a video enabled telemedicine application and verified that I am speaking with the correct person using two identifiers.   I discussed the limitations of evaluation and management by telemedicine and the availability of in person appointments. The patient expressed understanding and agreed to proceed.  Patient location: home Provider locations: office  Subjective:    CC: Medication refill  HPI: Pleasant 42 year old male presenting via MyChart video visit to follow-up on ADHD.  Has been stable on Vyvanse 50mg  daily prn.  Since he was out of work from his hip replacement, he did not need the medication but he has been able to go back to work now and is requesting refills.  Denies any side effects, concerning symptoms, sleep disturbance, etc.  History of anxiety and depression.  Has been taking Lexapro 10 mg daily, tolerating well without side effects.  Feels the medication works well.  Would like to have a refill of Xanax to use very sparingly for breakthrough anxiety/panic attacks.  Past medical history, Surgical history, Family history not pertinant except as noted below, Social history, Allergies, and medications have been entered into the medical record, reviewed, and corrections made.   Review of Systems: See HPI for pertinent positives and negatives.   Objective:    General: Speaking clearly in complete sentences without any shortness of breath.  Alert and oriented x3.  Normal judgment. No apparent acute distress.  Impression and Recommendations:    1. Attention deficit hyperactivity disorder (ADHD), combined type (Primary) Continue Vyvanse 50 mg daily as needed.  2. GAD (generalized anxiety disorder) Symptoms stable.  Continue Lexapro 10 mg daily.  Sending a small quantity of Xanax for very sparing use.  Patient aware of risks of tolerance and dependence with this medication.  I discussed  the assessment and treatment plan with the patient. The patient was provided an opportunity to ask questions and all were answered. The patient agreed with the plan and demonstrated an understanding of the instructions.   The patient was advised to call back or seek an in-person evaluation if the symptoms worsen or if the condition fails to improve as anticipated.  Return in about 6 months (around 11/03/2023) for ADHD follow up (in office).  Thayer Ohm, DNP, APRN, FNP-BC [facility] and [facility]

## 2023-09-06 ENCOUNTER — Other Ambulatory Visit: Payer: Self-pay | Admitting: Medical-Surgical

## 2023-09-06 ENCOUNTER — Encounter: Payer: Self-pay | Admitting: Medical-Surgical

## 2023-09-06 DIAGNOSIS — F902 Attention-deficit hyperactivity disorder, combined type: Secondary | ICD-10-CM

## 2023-09-06 MED ORDER — LISDEXAMFETAMINE DIMESYLATE 50 MG PO CAPS
50.0000 mg | ORAL_CAPSULE | Freq: Every day | ORAL | 0 refills | Status: DC
Start: 2023-10-06 — End: 2023-12-22

## 2023-09-06 MED ORDER — LISDEXAMFETAMINE DIMESYLATE 50 MG PO CAPS: 50.0000 mg | ORAL_CAPSULE | Freq: Every day | ORAL | 0 refills | Status: DC

## 2023-09-06 MED ORDER — LISDEXAMFETAMINE DIMESYLATE 50 MG PO CAPS
50.0000 mg | ORAL_CAPSULE | Freq: Every day | ORAL | 0 refills | Status: DC
Start: 2023-11-05 — End: 2023-12-22

## 2023-11-21 ENCOUNTER — Encounter: Payer: Self-pay | Admitting: Urgent Care

## 2023-11-21 ENCOUNTER — Ambulatory Visit: Admitting: Urgent Care

## 2023-11-21 VITALS — BP 114/77 | HR 82 | Resp 20 | Ht 65.0 in | Wt 224.0 lb

## 2023-11-21 DIAGNOSIS — T887XXA Unspecified adverse effect of drug or medicament, initial encounter: Secondary | ICD-10-CM

## 2023-11-21 DIAGNOSIS — R109 Unspecified abdominal pain: Secondary | ICD-10-CM

## 2023-11-21 MED ORDER — SIMETHICONE 180 MG PO CAPS: 1.0000 | ORAL_CAPSULE | Freq: Three times a day (TID) | ORAL | 0 refills | Status: DC | PRN

## 2023-11-21 MED ORDER — SIMETHICONE 180 MG PO CAPS: 1.0000 | ORAL_CAPSULE | Freq: Three times a day (TID) | ORAL | 0 refills | Status: AC | PRN

## 2023-11-21 MED ORDER — DICYCLOMINE HCL 10 MG PO CAPS: 10.0000 mg | ORAL_CAPSULE | Freq: Three times a day (TID) | ORAL | 0 refills | Status: DC

## 2023-11-21 MED ORDER — DICYCLOMINE HCL 10 MG PO CAPS
10.0000 mg | ORAL_CAPSULE | Freq: Three times a day (TID) | ORAL | 0 refills | Status: DC
Start: 2023-11-21 — End: 2023-12-04

## 2023-11-21 NOTE — Patient Instructions (Addendum)
 STOP tirzepatide. Please do not restart. Your side effects are secondary to this medication. Follow up with your weight loss specialist to discuss alternative options.  I have prescribed a medication to help with the cramping and bloating. If your symptoms persist or continue, please return for recheck.

## 2023-11-22 ENCOUNTER — Encounter: Payer: Self-pay | Admitting: Urgent Care
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
## 2023-11-28 ENCOUNTER — Encounter: Payer: Self-pay | Admitting: Medical-Surgical

## 2023-11-28 DIAGNOSIS — T887XXA Unspecified adverse effect of drug or medicament, initial encounter: Secondary | ICD-10-CM

## 2023-11-28 DIAGNOSIS — R109 Unspecified abdominal pain: Secondary | ICD-10-CM

## 2023-11-29 MED ORDER — TIRZEPATIDE-WEIGHT MANAGEMENT 2.5 MG/0.5ML ~~LOC~~ SOAJ: 2.5000 mg | SUBCUTANEOUS | 0 refills | Status: DC

## 2023-11-30 MED ORDER — TIRZEPATIDE-WEIGHT MANAGEMENT 2.5 MG/0.5ML ~~LOC~~ SOLN: 2.5000 mg | SUBCUTANEOUS | 0 refills | Status: DC

## 2023-11-30 NOTE — Addendum Note (Signed)
 Addended byBETHA WILLO MINI on: 11/30/2023 08:00 AM   Modules accepted: Orders

## 2023-12-01 ENCOUNTER — Encounter: Admitting: Medical-Surgical

## 2023-12-04 MED ORDER — DICYCLOMINE HCL 10 MG PO CAPS: 10.0000 mg | ORAL_CAPSULE | Freq: Three times a day (TID) | ORAL | 1 refills | Status: AC

## 2023-12-04 NOTE — Addendum Note (Signed)
 Addended byBETHA WILLO MINI on: 12/04/2023 02:12 PM   Modules accepted: Orders

## 2023-12-07 ENCOUNTER — Encounter: Admitting: Medical-Surgical

## 2023-12-19 ENCOUNTER — Other Ambulatory Visit: Payer: Self-pay | Admitting: Medical-Surgical

## 2023-12-22 ENCOUNTER — Encounter: Payer: Self-pay | Admitting: Medical-Surgical

## 2023-12-22 ENCOUNTER — Ambulatory Visit (INDEPENDENT_AMBULATORY_CARE_PROVIDER_SITE_OTHER): Admitting: Medical-Surgical

## 2023-12-22 VITALS — BP 103/66 | HR 77 | Resp 20 | Ht 65.0 in | Wt 214.0 lb

## 2023-12-22 DIAGNOSIS — R7303 Prediabetes: Secondary | ICD-10-CM

## 2023-12-22 DIAGNOSIS — Z0000 Encounter for general adult medical examination without abnormal findings: Secondary | ICD-10-CM

## 2023-12-22 DIAGNOSIS — E782 Mixed hyperlipidemia: Secondary | ICD-10-CM | POA: Diagnosis not present

## 2023-12-22 DIAGNOSIS — F411 Generalized anxiety disorder: Secondary | ICD-10-CM

## 2023-12-22 DIAGNOSIS — F988 Other specified behavioral and emotional disorders with onset usually occurring in childhood and adolescence: Secondary | ICD-10-CM

## 2023-12-22 DIAGNOSIS — F1320 Sedative, hypnotic or anxiolytic dependence, uncomplicated: Secondary | ICD-10-CM | POA: Diagnosis not present

## 2023-12-22 DIAGNOSIS — Z1211 Encounter for screening for malignant neoplasm of colon: Secondary | ICD-10-CM

## 2023-12-22 DIAGNOSIS — Z96642 Presence of left artificial hip joint: Secondary | ICD-10-CM

## 2023-12-22 DIAGNOSIS — E66812 Obesity, class 2: Secondary | ICD-10-CM | POA: Insufficient documentation

## 2023-12-22 MED ORDER — LISDEXAMFETAMINE DIMESYLATE 50 MG PO CAPS: 50.0000 mg | ORAL_CAPSULE | Freq: Every day | ORAL | 0 refills | Status: AC

## 2023-12-22 MED ORDER — ALPRAZOLAM 0.5 MG PO TABS: ORAL_TABLET | ORAL | 2 refills | Status: DC

## 2023-12-22 MED ORDER — LISDEXAMFETAMINE DIMESYLATE 50 MG PO CAPS: 50.0000 mg | ORAL_CAPSULE | Freq: Every day | ORAL | 0 refills | Status: DC

## 2023-12-22 NOTE — Progress Notes (Signed)
 Complete physical exam  Patient: Henry Marshall   DOB: 02-27-1981   43 y.o. Male  MRN: [id number]  Subjective:    Chief Complaint  Patient presents with   Annual Exam    Henry Marshall is a 43 y.o. male who presents today for a complete physical exam. He reports consuming a general diet. Stays physically active with walking and stretching. He generally feels well. He reports sleeping well. He does not have additional problems to discuss today.    Most recent fall risk assessment:    09/28/2022   10:57 AM  Fall Risk   Falls in the past year? 1  Number falls in past yr: 1  Injury with Fall? 0  Risk for fall due to : History of fall(s)  Follow up Falls evaluation completed     Most recent depression screenings:    05/05/2023    3:30 PM 10/13/2021   12:04 PM  PHQ 2/9 Scores  PHQ - 2 Score 0 0  PHQ- 9 Score 6 5    Vision:Within last year, Dental: No current dental problems and Receives regular dental care, and STD: The patient denies history of sexually transmitted disease.    Patient Care Team: Willo Mini, NP as PCP - General (Nurse Practitioner) [facility] as Physician Assistant (Dermatology)   Outpatient Medications Prior to Visit  Medication Sig   dicyclomine  (BENTYL ) 10 MG capsule Take 1 capsule (10 mg total) by mouth 4 (four) times daily -  before meals and at bedtime. As needed for cramping/ bloating   escitalopram  (LEXAPRO ) 10 MG tablet Take 1 tablet (10 mg total) by mouth daily. Take 1 tablet by mouth once daily   HYDROcodone -acetaminophen  (NORCO/VICODIN) 5-325 MG tablet Take 1 tablet by mouth daily as needed.   Simethicone  180 MG CAPS Take 1 capsule (180 mg total) by mouth 3 (three) times daily as needed (gas/flatulance/bloating).   ZEPBOUND  2.5 MG/0.5ML injection vial INJECT 0.5 ML (2.5 MG) UNDER THE SKIN ONCE WEEKLY (0.5ML= 50 UNITS)   [DISCONTINUED] ALPRAZolam  (XANAX ) 0.5 MG tablet TAKE 1 TABLET BY MOUTH ONCE DAILY AS NEEDED FOR ANXIETY. LIMIT USE TO NO  MORE THAN 3 TIMES WEEKLY. 15 TABLETS MUST LAST AT LEAST 30 DAYS.   [DISCONTINUED] lisdexamfetamine (VYVANSE ) 50 MG capsule Take 1 capsule (50 mg total) by mouth daily.   [DISCONTINUED] lisdexamfetamine (VYVANSE ) 50 MG capsule Take 1 capsule (50 mg total) by mouth daily.   [DISCONTINUED] lisdexamfetamine (VYVANSE ) 50 MG capsule Take 1 capsule (50 mg total) by mouth daily.   [DISCONTINUED] celecoxib  (CELEBREX ) 200 MG capsule One to 2 tablets by mouth daily as needed for pain.   No facility-administered medications prior to visit.    Review of Systems  Constitutional:  Negative for chills, fever, malaise/fatigue and weight loss.  HENT:  Positive for congestion. Negative for ear pain, hearing loss, sinus pain and sore throat.   Eyes:  Negative for blurred vision, photophobia and pain.  Respiratory:  Negative for cough, shortness of breath and wheezing.   Cardiovascular:  Negative for chest pain, palpitations and leg swelling.  Gastrointestinal:  Negative for abdominal pain, constipation, diarrhea, heartburn, nausea and vomiting.  Genitourinary:  Negative for dysuria, frequency and urgency.  Musculoskeletal:  Negative for falls and neck pain.  Skin:  Negative for itching and rash.  Neurological:  Negative for dizziness, weakness and headaches.  Endo/Heme/Allergies:  Negative for polydipsia. Does not bruise/bleed easily.  Psychiatric/Behavioral:  Negative for depression, substance abuse and suicidal ideas. The patient is nervous/anxious.  Objective:    BP 103/66 (BP Location: Right Arm, Cuff Size: Normal)   Pulse 77   Resp 20   Ht 5' 5 (1.651 m)   Wt 214 lb (97.1 kg)   SpO2 97%   BMI 35.61 kg/m    Physical Exam Vitals reviewed.  Constitutional:      General: He is not in acute distress.    Appearance: Normal appearance. He is obese. He is not ill-appearing.  HENT:     Head: Normocephalic and atraumatic.     Right Ear: Tympanic membrane, ear canal and external ear normal.  There is no impacted cerumen.     Left Ear: Tympanic membrane, ear canal and external ear normal. There is no impacted cerumen.     Nose: Nose normal. No congestion or rhinorrhea.     Mouth/Throat:     Mouth: Mucous membranes are moist.     Pharynx: No oropharyngeal exudate or posterior oropharyngeal erythema.  Eyes:     General: No scleral icterus.       Right eye: No discharge.        Left eye: No discharge.     Extraocular Movements: Extraocular movements intact.     Conjunctiva/sclera: Conjunctivae normal.     Pupils: Pupils are equal, round, and reactive to light.  Neck:     Thyroid: No thyromegaly.     Vascular: No carotid bruit or JVD.     Trachea: Trachea normal.  Cardiovascular:     Rate and Rhythm: Normal rate and regular rhythm.     Pulses: Normal pulses.     Heart sounds: Normal heart sounds. No murmur heard.    No friction rub. No gallop.  Pulmonary:     Effort: Pulmonary effort is normal. No respiratory distress.     Breath sounds: Normal breath sounds. No wheezing.  Abdominal:     General: Bowel sounds are normal. There is no distension.     Palpations: Abdomen is soft.     Tenderness: There is no abdominal tenderness. There is no guarding.  Musculoskeletal:        General: Normal range of motion.     Cervical back: Normal range of motion and neck supple.  Lymphadenopathy:     Cervical: No cervical adenopathy.  Skin:    General: Skin is warm and dry.  Neurological:     Mental Status: He is alert and oriented to person, place, and time.     Cranial Nerves: No cranial nerve deficit.  Psychiatric:        Mood and Affect: Mood normal.        Behavior: Behavior normal.        Thought Content: Thought content normal.        Judgment: Judgment normal.   No results found for any visits on 12/22/23.     Assessment & Plan:    Routine Health Maintenance and Physical Exam  Immunization History  Administered Date(s) Administered   Influenza,inj,Quad PF,6+ Mos  07/20/2018, 01/18/2019, 05/12/2020   Tdap 01/29/2011    Health Maintenance  Topic Date Due   Hepatitis B Vaccines (1 of 3 - 19+ 3-dose series) Never done   HPV VACCINES (1 - 3-dose SCDM series) Never done   Fecal DNA (Cologuard)  Never done   DTaP/Tdap/Td (2 - Td or Tdap) 05/04/2024 (Originally 01/28/2021)   INFLUENZA VACCINE  08/20/2024 (Originally 12/22/2023)   Meningococcal B Vaccine  Aged Out   COVID-19 Vaccine  Discontinued   Hepatitis C  Screening  Discontinued   HIV Screening  Discontinued    Discussed health benefits of physical activity, and encouraged him to engage in regular exercise appropriate for his age and condition.  1. Annual physical exam (Primary) Checking labs as below. UTD on preventative care. Wellness information provided with AVS. - Lipid panel - CBC with Differential/Platelet - CMP14+EGFR  2. GAD (generalized anxiety disorder) Stable on Lexapro  10 mg daily and as needed Xanax .  Aware of recommendations to limit Xanax  use to prevent tolerance and worsening dependence.  Continue Lexapro  and Xanax  as prescribed. - ALPRAZolam  (XANAX ) 0.5 MG tablet; TAKE 1 TABLET BY MOUTH ONCE DAILY AS NEEDED FOR ANXIETY. LIMIT USE TO NO MORE THAN 3 TIMES WEEKLY. 15 TABLETS MUST LAST AT LEAST 30 DAYS.  Dispense: 15 tablet; Refill: 2  3. Benzodiazepine dependence (HCC) As noted above, recommend limiting Xanax  use to only severe anxiety that does not resolve with other self soothing methods.  Aware of recommendations to use no more than 15 tablets per 30 days and agreeable to the plan.  4. Mixed hyperlipidemia Checking labs today. - Lipid panel - CMP14+EGFR  5. Obesity, Class II, BMI 35-39.9 Currently doing some mild exercise but nothing dedicated.  Checking TSH and A1c.  Continue Zepbound  2.5 mg for another 2 injections then plan for increased to 5 mg weekly. - TSH - Hemoglobin A1c  6. Colon cancer screening Discussed colon cancer screening options.  Prefers Cologuard.  Order  placed today. - Cologuard  7. Attention deficit disorder (ADD) in adult Stable on long-term treatment with Vyvanse  50 mg daily.  No change in regimen. - lisdexamfetamine (VYVANSE ) 50 MG capsule; Take 1 capsule (50 mg total) by mouth daily.  Dispense: 30 capsule; Refill: 0 - lisdexamfetamine (VYVANSE ) 50 MG capsule; Take 1 capsule (50 mg total) by mouth daily.  Dispense: 30 capsule; Refill: 0 - lisdexamfetamine (VYVANSE ) 50 MG capsule; Take 1 capsule (50 mg total) by mouth daily.  Dispense: 30 capsule; Refill: 0  8. Prediabetes Checking hemoglobin A1c.  Return in about 6 months (around 06/23/2024) for ADD follow up.     Britteney Ayotte, NP

## 2023-12-22 NOTE — Patient Instructions (Signed)

## 2023-12-23 LAB — LIPID PANEL
Chol/HDL Ratio: 5.1 ratio — ABNORMAL HIGH (ref 0.0–5.0)
Cholesterol, Total: 187 mg/dL (ref 100–199)
HDL: 37 mg/dL — ABNORMAL LOW (ref >39)
LDL Chol Calc (NIH): 125 mg/dL — ABNORMAL HIGH (ref 0–99)
Triglycerides: 140 mg/dL (ref 0–149)
VLDL Cholesterol Cal: 25 mg/dL (ref 5–40)

## 2023-12-23 LAB — CMP14+EGFR
ALT: 18 IU/L (ref 0–44)
AST: 13 IU/L (ref 0–40)
Albumin: 4.2 g/dL (ref 4.1–5.1)
Alkaline Phosphatase: 93 IU/L (ref 44–121)
BUN/Creatinine Ratio: 18 (ref 9–20)
BUN: 16 mg/dL (ref 6–24)
Bilirubin Total: 0.6 mg/dL (ref 0.0–1.2)
CO2: 19 mmol/L — ABNORMAL LOW (ref 20–29)
Calcium: 9.4 mg/dL (ref 8.7–10.2)
Chloride: 104 mmol/L (ref 96–106)
Creatinine, Ser: 0.9 mg/dL (ref 0.76–1.27)
Globulin, Total: 2.2 g/dL (ref 1.5–4.5)
Glucose: 83 mg/dL (ref 70–99)
Potassium: 4.3 mmol/L (ref 3.5–5.2)
Sodium: 141 mmol/L (ref 134–144)
Total Protein: 6.4 g/dL (ref 6.0–8.5)
eGFR: 107 mL/min/1.73 (ref >59)

## 2023-12-23 LAB — TSH: TSH: 1.81 u[IU]/mL (ref 0.450–4.500)

## 2023-12-23 LAB — HEMOGLOBIN A1C
Est. average glucose Bld gHb Est-mCnc: 108 mg/dL
Hgb A1c MFr Bld: 5.4 % (ref 4.8–5.6)

## 2023-12-23 LAB — CBC WITH DIFFERENTIAL/PLATELET
Basophils Absolute: 0 x10E3/uL (ref 0.0–0.2)
Basos: 1 %
EOS (ABSOLUTE): 0.4 x10E3/uL (ref 0.0–0.4)
Eos: 8 %
Hematocrit: 44.6 % (ref 37.5–51.0)
Hemoglobin: 14.3 g/dL (ref 13.0–17.7)
Immature Grans (Abs): 0 x10E3/uL (ref 0.0–0.1)
Immature Granulocytes: 0 %
Lymphocytes Absolute: 1.7 x10E3/uL (ref 0.7–3.1)
Lymphs: 33 %
MCH: 28.7 pg (ref 26.6–33.0)
MCHC: 32.1 g/dL (ref 31.5–35.7)
MCV: 90 fL (ref 79–97)
Monocytes Absolute: 0.5 x10E3/uL (ref 0.1–0.9)
Monocytes: 10 %
Neutrophils Absolute: 2.5 x10E3/uL (ref 1.4–7.0)
Neutrophils: 48 %
Platelets: 288 x10E3/uL (ref 150–450)
RBC: 4.98 x10E6/uL (ref 4.14–5.80)
RDW: 13 % (ref 11.6–15.4)
WBC: 5.3 x10E3/uL (ref 3.4–10.8)

## 2023-12-25 ENCOUNTER — Ambulatory Visit: Payer: Self-pay | Admitting: Medical-Surgical

## 2024-01-04 ENCOUNTER — Other Ambulatory Visit: Payer: Self-pay | Admitting: Medical-Surgical

## 2024-01-04 MED ORDER — ZEPBOUND 2.5 MG/0.5ML ~~LOC~~ SOLN: 2.5000 mg | SUBCUTANEOUS | 3 refills | Status: DC

## 2024-01-12 ENCOUNTER — Encounter: Payer: Self-pay | Admitting: Medical-Surgical

## 2024-03-11 ENCOUNTER — Ambulatory Visit: Admitting: Medical-Surgical

## 2024-03-21 ENCOUNTER — Ambulatory Visit: Admitting: Medical-Surgical

## 2024-05-26 ENCOUNTER — Encounter: Payer: Self-pay | Admitting: Medical-Surgical

## 2024-05-26 DIAGNOSIS — F988 Other specified behavioral and emotional disorders with onset usually occurring in childhood and adolescence: Secondary | ICD-10-CM

## 2024-05-26 DIAGNOSIS — F411 Generalized anxiety disorder: Secondary | ICD-10-CM

## 2024-05-28 MED ORDER — ALPRAZOLAM 0.5 MG PO TABS: ORAL_TABLET | ORAL | 0 refills | Status: AC

## 2024-05-28 MED ORDER — LISDEXAMFETAMINE DIMESYLATE 50 MG PO CAPS: 50.0000 mg | ORAL_CAPSULE | Freq: Every day | ORAL | 0 refills | Status: AC

## 2024-05-28 NOTE — Telephone Encounter (Signed)
 Spoke with the patient again and informed him that he would need to make a f/u appt. Before anymore refills can be sent in. He voiced his understanding

## 2024-06-26 ENCOUNTER — Other Ambulatory Visit: Payer: Self-pay | Admitting: Medical-Surgical

## 2024-12-24 ENCOUNTER — Encounter: Admitting: Medical-Surgical
